# Patient Record
Sex: Male | Born: 1985
Health system: Southern US, Community
[De-identification: ages and names within clinical notes are randomized; demographics above are authoritative.]

## PROBLEM LIST (undated history)

## (undated) DIAGNOSIS — E785 Hyperlipidemia, unspecified: Secondary | ICD-10-CM

## (undated) DIAGNOSIS — N200 Calculus of kidney: Secondary | ICD-10-CM

## (undated) HISTORY — DX: Hyperlipidemia, unspecified: E78.5

## (undated) HISTORY — DX: Calculus of kidney: N20.0

---

## 2007-05-26 HISTORY — PX: APPENDECTOMY: SHX54

## 2015-10-21 ENCOUNTER — Emergency Department (HOSPITAL_COMMUNITY)
Admission: EM | Admit: 2015-10-21 | Discharge: 2015-10-22 | Disposition: A | Discharge status: Home / Self Care | Payer: BLUE CROSS/BLUE SHIELD | Attending: Emergency Medicine | Admitting: Emergency Medicine

## 2015-10-21 ENCOUNTER — Emergency Department (HOSPITAL_COMMUNITY): Payer: BLUE CROSS/BLUE SHIELD

## 2015-10-21 ENCOUNTER — Encounter (HOSPITAL_COMMUNITY): Payer: Self-pay

## 2015-10-21 DIAGNOSIS — Z79899 Other long term (current) drug therapy: Secondary | ICD-10-CM | POA: Insufficient documentation

## 2015-10-21 DIAGNOSIS — R000 Tachycardia, unspecified: Secondary | ICD-10-CM | POA: Diagnosis not present

## 2015-10-21 DIAGNOSIS — N4889 Other specified disorders of penis: Secondary | ICD-10-CM | POA: Diagnosis not present

## 2015-10-21 DIAGNOSIS — N200 Calculus of kidney: Secondary | ICD-10-CM | POA: Diagnosis not present

## 2015-10-21 DIAGNOSIS — R109 Unspecified abdominal pain: Secondary | ICD-10-CM | POA: Diagnosis present

## 2015-10-21 LAB — URINALYSIS, ROUTINE W REFLEX MICROSCOPIC
BILIRUBIN URINE: NEGATIVE
Glucose, UA: NEGATIVE mg/dL
Ketones, ur: 15 mg/dL — AB
Leukocytes, UA: NEGATIVE
NITRITE: NEGATIVE
PROTEIN: NEGATIVE mg/dL
Specific Gravity, Urine: 1.025 (ref 1.005–1.030)
pH: 6.5 (ref 5.0–8.0)

## 2015-10-21 LAB — COMPREHENSIVE METABOLIC PANEL
ALK PHOS: 49 U/L (ref 38–126)
ALT: 25 U/L (ref 17–63)
ANION GAP: 10 (ref 5–15)
AST: 27 U/L (ref 15–41)
Albumin: 4.4 g/dL (ref 3.5–5.0)
BILIRUBIN TOTAL: 1.4 mg/dL — AB (ref 0.3–1.2)
BUN: 19 mg/dL (ref 6–20)
CALCIUM: 10 mg/dL (ref 8.9–10.3)
CO2: 25 mmol/L (ref 22–32)
CREATININE: 1.2 mg/dL (ref 0.61–1.24)
Chloride: 100 mmol/L — ABNORMAL LOW (ref 101–111)
Glucose, Bld: 108 mg/dL — ABNORMAL HIGH (ref 65–99)
Potassium: 3.4 mmol/L — ABNORMAL LOW (ref 3.5–5.1)
Sodium: 135 mmol/L (ref 135–145)
TOTAL PROTEIN: 7.8 g/dL (ref 6.5–8.1)

## 2015-10-21 LAB — URINE MICROSCOPIC-ADD ON

## 2015-10-21 LAB — CBC
HCT: 41.6 % (ref 39.0–52.0)
Hemoglobin: 14.3 g/dL (ref 13.0–17.0)
MCH: 28.6 pg (ref 26.0–34.0)
MCHC: 34.4 g/dL (ref 30.0–36.0)
MCV: 83.2 fL (ref 78.0–100.0)
PLATELETS: 278 10*3/uL (ref 150–400)
RBC: 5 MIL/uL (ref 4.22–5.81)
RDW: 13 % (ref 11.5–15.5)
WBC: 7.7 10*3/uL (ref 4.0–10.5)

## 2015-10-21 LAB — LIPASE, BLOOD: Lipase: 21 U/L (ref 11–51)

## 2015-10-21 MED ORDER — FENTANYL CITRATE (PF) 100 MCG/2ML IJ SOLN
INTRAMUSCULAR | Status: AC
  Filled 2015-10-21: qty 2

## 2015-10-21 MED ORDER — SODIUM CHLORIDE 0.9 % IV BOLUS (SEPSIS)
1000.0000 mL | Freq: Once | INTRAVENOUS | Status: AC
  Administered 2015-10-21: 1000 mL via INTRAVENOUS

## 2015-10-21 MED ORDER — FENTANYL CITRATE (PF) 100 MCG/2ML IJ SOLN
50.0000 ug | INTRAMUSCULAR | Status: DC | PRN
  Administered 2015-10-21: 50 ug via INTRAVENOUS
  Filled 2015-10-21: qty 2

## 2015-10-21 MED ORDER — FENTANYL CITRATE (PF) 100 MCG/2ML IJ SOLN
50.0000 ug | INTRAMUSCULAR | Status: DC | PRN
  Administered 2015-10-21: 50 ug via NASAL

## 2015-10-21 NOTE — ED Provider Notes (Signed)
CSN: [id number]     Arrival date & time 10/21/15  1958 History   First MD Initiated Contact with Patient 10/21/15 2105     Chief Complaint  Patient presents with  . Flank Pain     (Consider location/radiation/quality/duration/timing/severity/associated sxs/prior Treatment) Patient is a 30 y.o. male presenting with general illness. The history is provided by the patient.  Illness Location:  Left-sided flank pain Severity:  Moderate Onset quality:  Sudden Duration:  3 hours Timing:  Constant Progression:  Unchanged Chronicity:  New Context:  History of kidney stones. Acute onset of left-sided flank pain 3 hours prior to arrival. Feels like prior kidney stones. Endorses nausea without emesis. Endorses some dysuria. No hematuria. No abdominal pain. Associated symptoms: nausea   Associated symptoms: no abdominal pain, no chest pain, no diarrhea, no fever, no headaches, no shortness of breath and no vomiting     History reviewed. No pertinent past medical history. History reviewed. No pertinent past surgical history. No family history on file. Social History  Substance Use Topics  . Smoking status: Never Smoker   . Smokeless tobacco: None  . Alcohol Use: No    Review of Systems  Constitutional: Negative for fever and chills.  Respiratory: Negative for shortness of breath.   Cardiovascular: Negative for chest pain.  Gastrointestinal: Positive for nausea. Negative for vomiting, abdominal pain and diarrhea.  Genitourinary: Positive for dysuria, flank pain and penile pain. Negative for hematuria, discharge, penile swelling, scrotal swelling, genital sores and testicular pain.  Neurological: Negative for light-headedness and headaches.  All other systems reviewed and are negative.     Allergies  Augmentin; Ceclor; and Codeine  Home Medications   Prior to Admission medications   Medication Sig Start Date End Date Taking? Authorizing Provider  simvastatin (ZOCOR) 10 MG tablet  Take 10 mg by mouth at bedtime.   Yes Historical Provider, MD   BP 141/86 mmHg  Pulse 90  Temp(Src) 97.4 F (36.3 C) (Oral)  Resp 16  SpO2 100% Physical Exam  Constitutional: He is oriented to person, place, and time. He appears well-developed and well-nourished. He appears distressed (Appears uncomfortable).  HENT:  Head: Normocephalic and atraumatic.  Eyes: EOM are normal. Pupils are equal, round, and reactive to light.  Cardiovascular: Regular rhythm and intact distal pulses.  Tachycardia present.   Pulmonary/Chest: Effort normal. No respiratory distress.  Abdominal: Soft. There is no tenderness. There is CVA tenderness. There is no rebound, no guarding, no tenderness at McBurney's point and negative Murphy's sign. Hernia confirmed negative in the right inguinal area and confirmed negative in the left inguinal area.  Genitourinary: Testes normal and penis normal. Right testis shows no tenderness. Cremasteric reflex is not absent on the right side. Left testis shows no tenderness. Cremasteric reflex is not absent on the left side. Circumcised. No penile tenderness. No discharge found.  Musculoskeletal: He exhibits no edema.  Neurological: He is alert and oriented to person, place, and time.  Skin: Skin is warm and dry.    ED Course  Procedures (including critical care time) Labs Review Labs Reviewed  COMPREHENSIVE METABOLIC PANEL - Abnormal; Notable for the following:    Potassium 3.4 (*)    Chloride 100 (*)    Glucose, Bld 108 (*)    Total Bilirubin 1.4 (*)    All other components within normal limits  URINALYSIS, ROUTINE W REFLEX MICROSCOPIC (NOT AT [facility]) - Abnormal; Notable for the following:    Hgb urine dipstick TRACE (*)  Ketones, ur 15 (*)    All other components within normal limits  URINE MICROSCOPIC-ADD ON - Abnormal; Notable for the following:    Squamous Epithelial / LPF 0-5 (*)    Bacteria, UA FEW (*)    All other components within normal limits  LIPASE, BLOOD   CBC    Imaging Review Ct Renal Stone Study  10/21/2015  CLINICAL DATA:  Left flank pain for 2 hours. History of kidney stones. EXAM: CT ABDOMEN AND PELVIS WITHOUT CONTRAST TECHNIQUE: Multidetector CT imaging of the abdomen and pelvis was performed following the standard protocol without IV contrast. COMPARISON:  None. FINDINGS: Lower chest:  The included lung bases are clear. Liver: No focal lesion allowing for lack contrast. Hepatobiliary: Gallbladder physiologically distended, no calcified stone. No biliary dilatation. Pancreas: No ductal dilatation or inflammation. Spleen: Normal. Adrenal glands: Left adrenal thickening without discrete nodule. Right adrenal gland is normal. Kidneys: There is a punctate obstructing stone at the left ureterovesicular junction with mild proximal hydroureteronephrosis. Minimal perinephric stranding. Punctate nonobstructing stone in the interpolar left kidney. Questionable punctate stone in the lower right kidney. No right obstructive uropathy. Right ureter is decompressed. Stomach/Bowel: Stomach physiologically distended. No bowel obstruction. Within the left mid abdomen is a single prominent proximal jejunal bowel loop with internal low-density. Small volume of stool throughout the colon without colonic wall thickening. The appendix is surgically absent. Vascular/Lymphatic: There are multiple small mesenteric lymph nodes in the left mid abdomen, largest measuring 2.3 x 0.9 cm. Haziness in the left upper quadrant mesentery in the region of increased lymph nodes. No retroperitoneal adenopathy. Abdominal aorta is normal in caliber. Reproductive: Normal sized prostate gland. Bladder: Physiologically distended, no wall thickening. Other: No free air, free fluid, or intra-abdominal fluid collection. Musculoskeletal: There are no acute or suspicious osseous abnormalities. Ossification within the left adductor musculature consistent with remote prior injury. IMPRESSION: 1.  Obstructing punctate stone at the left ureterovesicular junction with mild proximal hydroureteronephrosis. 2. Additional punctate bilateral nonobstructing nephrolithiasis. 3. Prominent mesenteric lymph nodes and haziness in the adjacent mesentery in the left mid abdomen. There is an adjacent prominent proximal jejunal bowel loop with internal low-density, which may be simply ingested contents, however the adjacent lymph nodes raises concern for underlying infectious or potentially neoplastic etiology. Comparison with any prior imaging would be most helpful to evaluate for chronicity. In the absence of prior imaging, recommend a follow-up CT in 2-3 months, WITH oral contrast. Electronically Signed   By: Rubye Oaks M.D.   On: 10/21/2015 22:14   I have personally reviewed and evaluated these images and lab results as part of my medical decision-making.   EKG Interpretation None      MDM   Final diagnoses:  Nephrolithiasis    30 year old male with a history of nephrolithiasis presented with left-sided flank pain. History as above. Patient didn't appear uncomfortable on arrival. Patient had no tenderness to palpation of his abdomen. No perineal signs. Doubt acute abdomen. GU exam was normal. No evidence of any inguinal hernia. No evidence of testicular torsion. No tenderness palpation of testicles to indicate orchitis or epididymitis. CT of his abdomen pelvis did show evidence of nephrolithiasis. Mild hydronephrosis. Stomach is punctate in size; will likely be able to pass on its own. Gave the patient an IV fluid bolus, pain medications, antiemetics with improvement in symptoms. Feel that the patient is appropriate for discharge home with outpatient follow-up. Will give up her prescription for antiemetics, pain medications, Flomax. Will have him follow-up with  his primary care doctor as soon as possible for reevaluation.  Stricture precautions provided. Patient discharged in stable  condition.  Lindalou Hose, MD 10/22/15 1610  Vanetta Mulders, MD 10/22/15 [phone]

## 2015-10-21 NOTE — ED Notes (Signed)
Pt here with c/o left flank pain and burning to penis, onset today. Hx of kidney stones.

## 2015-10-22 MED ORDER — OXYCODONE-ACETAMINOPHEN 5-325 MG PO TABS: 1.0000 | ORAL_TABLET | Freq: Four times a day (QID) | ORAL | Status: DC | PRN

## 2015-10-22 MED ORDER — ONDANSETRON HCL 4 MG PO TABS: 4.0000 mg | ORAL_TABLET | Freq: Three times a day (TID) | ORAL | Status: DC | PRN

## 2015-10-22 NOTE — Discharge Instructions (Signed)
You have a kidney stone.  Take Percocet for pain control. Use Motrin as well. Take Zofran for nausea control.  See another doctor in a couple days for reevaluation.

## 2015-10-25 ENCOUNTER — Encounter: Payer: Self-pay | Admitting: Physician Assistant

## 2015-11-06 ENCOUNTER — Ambulatory Visit (INDEPENDENT_AMBULATORY_CARE_PROVIDER_SITE_OTHER): Payer: BLUE CROSS/BLUE SHIELD | Admitting: Physician Assistant

## 2015-11-06 ENCOUNTER — Encounter: Payer: Self-pay | Admitting: *Deleted

## 2015-11-06 ENCOUNTER — Other Ambulatory Visit (INDEPENDENT_AMBULATORY_CARE_PROVIDER_SITE_OTHER): Payer: BLUE CROSS/BLUE SHIELD

## 2015-11-06 VITALS — BP 100/60 | HR 80 | Ht 74.0 in | Wt 221.0 lb

## 2015-11-06 DIAGNOSIS — R935 Abnormal findings on diagnostic imaging of other abdominal regions, including retroperitoneum: Secondary | ICD-10-CM

## 2015-11-06 DIAGNOSIS — R195 Other fecal abnormalities: Secondary | ICD-10-CM

## 2015-11-06 DIAGNOSIS — N201 Calculus of ureter: Secondary | ICD-10-CM

## 2015-11-06 LAB — SEDIMENTATION RATE: Sed Rate: 5 mm/hr (ref 0–15)

## 2015-11-06 LAB — HIGH SENSITIVITY CRP: CRP HIGH SENSITIVITY: 2.19 mg/L (ref 0.000–5.000)

## 2015-11-06 NOTE — Patient Instructions (Signed)
Your physician has requested that you go to the basement for the following lab work before leaving today: Sed rate, CRP  You have been scheduled for a CT enterography scan of the abdomen and pelvis at [facility] ([address]---this is in the same building as Press [facility]).   You are scheduled on Monday, 11/11/15 at 3:00 pm. You should arrive at 1:45 pm for registration and prep. Please follow the written instructions below on the day of your exam:  WARNING: IF YOU ARE ALLERGIC TO IODINE/X-RAY DYE, PLEASE NOTIFY RADIOLOGY IMMEDIATELY AT [phone]! YOU WILL BE GIVEN A 13 HOUR PREMEDICATION PREP.  1) Do not eat or drink anything after 11:00 am (4 hours prior to your test)  You may take any medications as prescribed with a small amount of water except for the following: Metformin, Glucophage, Glucovance, Avandamet, Riomet, Fortamet, Actoplus Met, Janumet, Glumetza or Metaglip. The above medications must be held the day of the exam AND 48 hours after the exam.  If you have any questions regarding your exam or if you need to reschedule, you may call the CT department at [phone] between the hours of 8:00 am and 5:00 pm, Monday-Friday.  ________________________________________________________________________  If you are age 53 or older, your body mass index should be between 23-30. Your Body mass index is 28.36 kg/(m^2). If this is out of the aforementioned range listed, please consider follow up with your Primary Care Provider.  If you are age 36 or younger, your body mass index should be between 19-25. Your Body mass index is 28.36 kg/(m^2). If this is out of the aformentioned range listed, please consider follow up with your Primary Care Provider.

## 2015-11-06 NOTE — Progress Notes (Signed)
Patient ID: David Osborn, male   DOB: 10/04/1985, 30 y.o.   MRN: [id number]   Subjective:    Patient ID: David Osborn, male    DOB: 07/21/1985, 30 y.o.   MRN: [id number]  HPI  David Osborn is a pleasant 30 year old white male, new today referred by Dr. Leota [facility]sen[facility] for further evaluation of an abnormal CT of the abdomen. Patient had an ER visit on 10/21/2015 with acute left flank pain and underwent a noncontrasted stone protocol CT of the abdomen which did show a punctate obstructing stone in the left UVJ junction with proximal hydronephrosis and mild perinephric stranding. He also has a probable punctate stone in the right kidney. It was also noted that he had multiple mesenteric lymph nodes and haziness in the left upper quadrant mesentery  There is no adjacent prominent proximal jejunal bowel loop with internal low-density easing concern for underlying infectious or essentially neoplastic etiology. Follow-up imaging in 2-3 months was recommended with contrast. Labs are reviewed from his ER visit, he had a normal CBC, potassium 3.4 total bili 1.4 ,and remainder of liver tests normal.   He says he passed a kidney stone shortly after that visit and feels fine. He has had no prior GI issues has no complaints of abdominal pain. His appetite has been fine, weight has been stable. He has noticed over the past year that he has had persistently looser stools than what used to be normal for him. He is also generally having 2-3 bowel movements per day where he used to have one regular bowel movement per day. He has not noted any melena or hematochezia. He is status post appendectomy ,no other abdominal surgery No family history of IBD does have a paternal grandfather who had colon cancer in his 2560s and father has polyps.  Review of Systems Pertinent positive and negative review of systems were noted in the above HPI section.  All other review of systems was otherwise negative.  Outpatient Encounter Prescriptions  as of 11/06/2015  Medication Sig  . simvastatin (ZOCOR) 10 MG tablet Take 10 mg by mouth at bedtime.  . [DISCONTINUED] ondansetron (ZOFRAN) 4 MG tablet Take 1 tablet (4 mg total) by mouth every 8 (eight) hours as needed for nausea or vomiting.  . [DISCONTINUED] oxyCODONE-acetaminophen (PERCOCET/ROXICET) 5-325 MG tablet Take 1 tablet by mouth every 6 (six) hours as needed for severe pain.   No facility-administered encounter medications on file as of 11/06/2015.   Allergies  Allergen Reactions  . Augmentin [Amoxicillin-Pot Clavulanate]   . Ceclor [Cefaclor]   . Codeine    Patient Active Problem List   Diagnosis Date Noted  . Ureterolithiasis 11/06/2015   Social History   Social History  . Marital Status: Married    Spouse Name: N/A  . Number of Children: 2  . Years of Education: N/A   Occupational History  . Forensic scientistchemical engineer    Social History Main Topics  . Smoking status: Never Smoker   . Smokeless tobacco: Never Used  . Alcohol Use: 0.0 oz/week    0 Standard drinks or equivalent per week     Comment: social  . Drug Use: No  . Sexual Activity: Not on file   Other Topics Concern  . Not on file   Social History Narrative    David Osborn's family history includes Colon cancer in his paternal grandfather; Colon polyps in his father and paternal grandfather; Diabetes in his maternal grandmother and mother; Heart disease in his maternal grandmother and paternal grandfather;  Kidney nephrosis in his father and paternal grandfather; Prostate cancer in his paternal grandfather.      Objective:    Filed Vitals:   11/06/15 1438  BP: 100/60  Pulse: 80    Physical Exam  well-developed young white male in no acute distress, accompanied by his wife blood pressure 100/60 pulse 80, height foot 2 weight 221 BMI 28. HEENT ;nontraumatic, cephalic EOMI PERRLA sclera anicteric, Cardiovascular; regular rate and rhythm with S1-S2 no murmur or gallop, Pulmonary; clear bilaterally, Abdomen  ;soft, nontender, nondistended bowel sounds are active there is no palpable mass or hepatosplenomegaly. Rectal ;exam not done, Extremities; no clubbing cyanosis or edema skin warm and dry, Neuropsych; mood and affect appropriate     Assessment & Plan:   #83 30 year old white male with recent noncontrasted CT of the abdomen, abnormal with finding of prominent mesenteric lymph nodes easiness in the adjacent mesentery in the left mid abdomen adjacent to a prominent proximal jejunal bowel loop. Etiology not clear rule out underlying infectious, inflammatory or potentially neoplastic process. Patient currently asymptomatic with the exception of change in bowel habits over the past 1 year, with more frequent looser stools. #2 History of ureteral lithiasis with recently passed stone  Plan; check sedimentation rate and CRP Will schedule for CT of the abdomen/pelvis  with enterography. Further plans pending results of above.  Patient will be established with David Osborn   David Mellone S Mckale Haffey PA-C 11/06/2015   Cc: No ref. provider found

## 2015-11-07 NOTE — Progress Notes (Signed)
Agree 

## 2015-11-11 ENCOUNTER — Ambulatory Visit (INDEPENDENT_AMBULATORY_CARE_PROVIDER_SITE_OTHER)
Admission: RE | Admit: 2015-11-11 | Discharge: 2015-11-11 | Disposition: A | Discharge status: Home / Self Care | Payer: BLUE CROSS/BLUE SHIELD | Source: Ambulatory Visit | Attending: Physician Assistant | Admitting: Physician Assistant

## 2015-11-11 DIAGNOSIS — R935 Abnormal findings on diagnostic imaging of other abdominal regions, including retroperitoneum: Secondary | ICD-10-CM

## 2015-11-11 DIAGNOSIS — R195 Other fecal abnormalities: Secondary | ICD-10-CM

## 2015-11-11 MED ORDER — IOPAMIDOL (ISOVUE-300) INJECTION 61%
100.0000 mL | Freq: Once | INTRAVENOUS | Status: AC | PRN
  Administered 2015-11-11: 100 mL via INTRAVENOUS

## 2015-11-11 MED ORDER — BARIUM SULFATE 0.1 % PO SUSP: 450.0000 mL | Freq: Once | ORAL | Status: DC

## 2016-04-20 ENCOUNTER — Encounter (HOSPITAL_BASED_OUTPATIENT_CLINIC_OR_DEPARTMENT_OTHER): Payer: Self-pay

## 2016-04-20 ENCOUNTER — Emergency Department (HOSPITAL_BASED_OUTPATIENT_CLINIC_OR_DEPARTMENT_OTHER)
Admission: EM | Admit: 2016-04-20 | Discharge: 2016-04-20 | Disposition: A | Discharge status: Home / Self Care | Payer: BLUE CROSS/BLUE SHIELD | Attending: Emergency Medicine | Admitting: Emergency Medicine

## 2016-04-20 ENCOUNTER — Emergency Department (HOSPITAL_BASED_OUTPATIENT_CLINIC_OR_DEPARTMENT_OTHER): Payer: BLUE CROSS/BLUE SHIELD

## 2016-04-20 DIAGNOSIS — Z79899 Other long term (current) drug therapy: Secondary | ICD-10-CM | POA: Diagnosis not present

## 2016-04-20 DIAGNOSIS — R05 Cough: Secondary | ICD-10-CM | POA: Diagnosis present

## 2016-04-20 DIAGNOSIS — J189 Pneumonia, unspecified organism: Secondary | ICD-10-CM | POA: Insufficient documentation

## 2016-04-20 DIAGNOSIS — J9801 Acute bronchospasm: Secondary | ICD-10-CM | POA: Insufficient documentation

## 2016-04-20 MED ORDER — LEVOFLOXACIN 750 MG PO TABS
750.0000 mg | ORAL_TABLET | Freq: Once | ORAL | Status: AC
  Administered 2016-04-20: 750 mg via ORAL
  Filled 2016-04-20: qty 1

## 2016-04-20 MED ORDER — ALBUTEROL SULFATE (2.5 MG/3ML) 0.083% IN NEBU
5.0000 mg | INHALATION_SOLUTION | Freq: Once | RESPIRATORY_TRACT | Status: AC
Start: 2016-04-20 — End: 2016-04-20
  Administered 2016-04-20: 5 mg via RESPIRATORY_TRACT
  Filled 2016-04-20: qty 6

## 2016-04-20 MED ORDER — ALBUTEROL SULFATE HFA 108 (90 BASE) MCG/ACT IN AERS
2.0000 | INHALATION_SPRAY | Freq: Once | RESPIRATORY_TRACT | Status: AC
  Administered 2016-04-20: 2 via RESPIRATORY_TRACT
  Filled 2016-04-20: qty 6.7

## 2016-04-20 MED ORDER — IPRATROPIUM BROMIDE 0.02 % IN SOLN
0.5000 mg | Freq: Once | RESPIRATORY_TRACT | Status: AC
  Administered 2016-04-20: 0.5 mg via RESPIRATORY_TRACT
  Filled 2016-04-20: qty 2.5

## 2016-04-20 MED ORDER — LEVOFLOXACIN 750 MG PO TABS: 750.0000 mg | ORAL_TABLET | Freq: Every day | ORAL | 0 refills | Status: DC

## 2016-04-20 MED FILL — levoFLOXacin 750 MG TABS: 750 | 6 days supply | Qty: 6 | Fill #0

## 2016-04-20 NOTE — ED Provider Notes (Signed)
[facility] Provider Note   CSN: [id number] Arrival date & time: 04/20/16  1243     History   Chief Complaint Chief Complaint  Patient presents with  . Cough    HPI David Osborn is a 30 y.o. male.  HPI The patient is a healthy 30 year old man who presents to emergency department with ongoing productive cough with some occasional shortness of breath since Thursday yesterday.  Was sent to the ER from the [facility] for further evaluation.  No fevers or chills.  Does report productive cough.  Patient has multiple children under the age of 8 at home.  Several been sick with viral illness.  He denies abdominal pain.  No chest pain.   Past Medical History:  Diagnosis Date  . Hyperlipidemia   . Nephrolithiasis     Patient Active Problem List   Diagnosis Date Noted  . Ureterolithiasis 11/06/2015    Past Surgical History:  Procedure Laterality Date  . APPENDECTOMY  2009       Home Medications    Prior to Admission medications   Medication Sig Start Date End Date Taking? Authorizing Provider  neomycin-polymyxin-hydrocortisone (CORTISPORIN) 3.5-10000-1 ophthalmic suspension 4 (four) times daily.   Yes Historical Provider, MD  levofloxacin (LEVAQUIN) 750 MG tablet Take 1 tablet (750 mg total) by mouth daily. 04/21/16   Azalia BilisKevin Shaely Gadberry, MD  simvastatin (ZOCOR) 10 MG tablet Take 10 mg by mouth at bedtime.    Historical Provider, MD    Family History Family History  Problem Relation Age of Onset  . Colon cancer Paternal Grandfather   . Colon polyps Paternal Grandfather   . Prostate cancer Paternal Grandfather   . Heart disease Paternal Grandfather   . Kidney nephrosis Paternal Grandfather   . Colon polyps Father   . Kidney nephrosis Father   . Diabetes Mother   . Diabetes Maternal Grandmother   . Heart disease Maternal Grandmother     Social History Social History  Substance Use Topics  . Smoking status: Never Smoker  . Smokeless tobacco:  Never Used  . Alcohol use 0.0 oz/week     Comment: social     Allergies   Augmentin [amoxicillin-pot clavulanate]; Ceclor [cefaclor]; and Codeine   Review of Systems Review of Systems  All other systems reviewed and are negative.    Physical Exam Updated Vital Signs BP 114/78 (BP Location: Left Arm)   Pulse 95   Temp 98 F (36.7 C) (Oral)   Resp 18   Ht 6\' 3"  (1.905 m)   Wt 226 lb 3.2 oz (102.6 kg)   SpO2 96%   BMI 28.27 kg/m   Physical Exam  Constitutional: He is oriented to person, place, and time. He appears well-developed and well-nourished.  HENT:  Head: Normocephalic and atraumatic.  Eyes: EOM are normal.  Neck: Normal range of motion.  Cardiovascular: Normal rate, regular rhythm, normal heart sounds and intact distal pulses.   Pulmonary/Chest: Effort normal. No respiratory distress. He has wheezes.  Mild rhonchi bilaterally  Abdominal: Soft. He exhibits no distension. There is no tenderness.  Musculoskeletal: Normal range of motion.  Neurological: He is alert and oriented to person, place, and time.  Skin: Skin is warm and dry.  Psychiatric: He has a normal mood and affect. Judgment normal.  Nursing note and vitals reviewed.    ED Treatments / Results  Labs (all labs ordered are listed, but only abnormal results are displayed) Labs Reviewed - No data to display  EKG  EKG Interpretation None       Radiology Dg Chest 2 View  Result Date: 04/20/2016 CLINICAL DATA:  Productive cough. EXAM: CHEST  2 VIEW COMPARISON:  No recent prior. FINDINGS: Mediastinum and hilar structures normal . Minimal right infrahilar infiltrate cannot be excluded. No pertinent effusion or pneumothorax. Heart size normal. No acute bony abnormality . IMPRESSION: Minimal right infrahilar infiltrate cannot be completely excluded . Electronically Signed   By: Maisie Fushomas  Register   On: 04/20/2016 13:09    Procedures Procedures (including critical care time)  Medications Ordered  in ED Medications  albuterol (PROVENTIL) (2.5 MG/3ML) 0.083% nebulizer solution 5 mg (5 mg Nebulization Given 04/20/16 1323)  ipratropium (ATROVENT) nebulizer solution 0.5 mg (0.5 mg Nebulization Given 04/20/16 1323)  levofloxacin (LEVAQUIN) tablet 750 mg (750 mg Oral Given 04/20/16 1328)  albuterol (PROVENTIL HFA;VENTOLIN HFA) 108 (90 Base) MCG/ACT inhaler 2 puff (2 puffs Inhalation Given 04/20/16 1431)     Initial Impression / Assessment and Plan / ED Course  I have reviewed the triage vital signs and the nursing notes.  Pertinent labs & imaging results that were available during my care of the patient were reviewed by me and considered in my medical decision making (see chart for details).  Clinical Course     Developing pneumonia with associated bronchospasm.  Patient feels much better after bronchodilators.  Home with albuterol.  Home with a week course of Levaquin.  Patient understands return to the ER for new or worsening symptoms  Final Clinical Impressions(s) / ED Diagnoses   Final diagnoses:  Community acquired pneumonia, unspecified laterality  Bronchospasm    New Prescriptions New Prescriptions   LEVOFLOXACIN (LEVAQUIN) 750 MG TABLET    Take 1 tablet (750 mg total) by mouth daily.     Azalia BilisKevin Jonathon Tan, MD 04/20/16 [phone]

## 2016-04-20 NOTE — ED Triage Notes (Signed)
C/o prod cough since last Thursday-sent from health center at work to r/o PNE-NAD-steady gait

## 2016-07-08 ENCOUNTER — Ambulatory Visit (HOSPITAL_COMMUNITY)
Admission: EM | Admit: 2016-07-08 | Discharge: 2016-07-08 | Disposition: A | Discharge status: Home / Self Care | Payer: BLUE CROSS/BLUE SHIELD | Attending: Family Medicine | Admitting: Family Medicine

## 2016-07-08 ENCOUNTER — Encounter (HOSPITAL_COMMUNITY): Payer: Self-pay | Admitting: Emergency Medicine

## 2016-07-08 DIAGNOSIS — J0101 Acute recurrent maxillary sinusitis: Secondary | ICD-10-CM

## 2016-07-08 DIAGNOSIS — R05 Cough: Secondary | ICD-10-CM

## 2016-07-08 DIAGNOSIS — R69 Illness, unspecified: Principal | ICD-10-CM

## 2016-07-08 DIAGNOSIS — J40 Bronchitis, not specified as acute or chronic: Secondary | ICD-10-CM

## 2016-07-08 DIAGNOSIS — R059 Cough, unspecified: Secondary | ICD-10-CM

## 2016-07-08 DIAGNOSIS — J111 Influenza due to unidentified influenza virus with other respiratory manifestations: Secondary | ICD-10-CM

## 2016-07-08 MED ORDER — ALBUTEROL SULFATE HFA 108 (90 BASE) MCG/ACT IN AERS: 1.0000 | INHALATION_SPRAY | Freq: Four times a day (QID) | RESPIRATORY_TRACT | 0 refills | Status: DC | PRN

## 2016-07-08 MED ORDER — KETOROLAC TROMETHAMINE 60 MG/2ML IM SOLN: 60.0000 mg | Freq: Once | INTRAMUSCULAR | Status: DC

## 2016-07-08 MED ORDER — BENZONATATE 100 MG PO CAPS: 200.0000 mg | ORAL_CAPSULE | Freq: Three times a day (TID) | ORAL | 0 refills | Status: DC | PRN

## 2016-07-08 MED ORDER — METHYLPREDNISOLONE 4 MG PO TBPK: ORAL_TABLET | ORAL | 0 refills | Status: DC

## 2016-07-08 MED ORDER — CLARITHROMYCIN 500 MG PO TABS: 500.0000 mg | ORAL_TABLET | Freq: Two times a day (BID) | ORAL | 0 refills | Status: DC

## 2016-07-08 MED ORDER — ALBUTEROL SULFATE (2.5 MG/3ML) 0.083% IN NEBU
INHALATION_SOLUTION | RESPIRATORY_TRACT | Status: AC
  Filled 2016-07-08: qty 3

## 2016-07-08 MED ORDER — ALBUTEROL SULFATE (2.5 MG/3ML) 0.083% IN NEBU
2.5000 mg | INHALATION_SOLUTION | Freq: Once | RESPIRATORY_TRACT | Status: AC
  Administered 2016-07-08: 2.5 mg via RESPIRATORY_TRACT

## 2016-07-08 MED ORDER — OSELTAMIVIR PHOSPHATE 75 MG PO CAPS: 75.0000 mg | ORAL_CAPSULE | Freq: Two times a day (BID) | ORAL | 0 refills | Status: DC

## 2016-07-08 NOTE — ED Provider Notes (Signed)
CSN: [id number]     Arrival date & time 07/08/16  1027 History   First MD Initiated Contact with Patient 07/08/16 1203     Chief Complaint  Patient presents with  . Facial Pain  . Cough   (Consider location/radiation/quality/duration/timing/severity/associated sxs/prior Treatment) Patient c/o sinus pressure and discharge for a week and he developed chills fever and weakness yesterday.  He has also developed a cough and wheezing.  He has been using his SABA rescue inhaler and is just about out.   The history is provided by the patient.  Cough  Cough characteristics:  Non-productive Sputum characteristics:  Nondescript Severity:  Moderate Onset quality:  Sudden Duration:  1 week Timing:  Constant Progression:  Worsening Chronicity:  New Smoker: no   Context: upper respiratory infection and weather changes   Relieved by:  Nothing Worsened by:  Nothing Ineffective treatments:  Beta-agonist inhaler Associated symptoms: wheezing     Past Medical History:  Diagnosis Date  . Hyperlipidemia   . Nephrolithiasis    Past Surgical History:  Procedure Laterality Date  . APPENDECTOMY  2009   Family History  Problem Relation Age of Onset  . Colon cancer Paternal Grandfather   . Colon polyps Paternal Grandfather   . Prostate cancer Paternal Grandfather   . Heart disease Paternal Grandfather   . Kidney nephrosis Paternal Grandfather   . Colon polyps Father   . Kidney nephrosis Father   . Diabetes Mother   . Diabetes Maternal Grandmother   . Heart disease Maternal Grandmother    Social History  Substance Use Topics  . Smoking status: Never Smoker  . Smokeless tobacco: Never Used  . Alcohol use 0.0 oz/week     Comment: social    Review of Systems  Constitutional: Positive for fatigue.  HENT: Negative.   Eyes: Negative.   Respiratory: Positive for cough and wheezing.   Cardiovascular: Negative.   Gastrointestinal: Negative.   Endocrine: Negative.   Genitourinary:  Negative.   Musculoskeletal: Negative.   Allergic/Immunologic: Negative.   Neurological: Negative.   Hematological: Negative.   Psychiatric/Behavioral: Negative.     Allergies  Augmentin [amoxicillin-pot clavulanate]; Ceclor [cefaclor]; and Codeine  Home Medications   Prior to Admission medications   Medication Sig Start Date End Date Taking? Authorizing Provider  simvastatin (ZOCOR) 10 MG tablet Take 10 mg by mouth at bedtime.   Yes Historical Provider, MD  albuterol (PROVENTIL HFA;VENTOLIN HFA) 108 (90 Base) MCG/ACT inhaler Inhale 1-2 puffs into the lungs every 6 (six) hours as needed for wheezing or shortness of breath. 07/08/16   Deatra CanterWilliam J Marvell Stavola, FNP  benzonatate (TESSALON) 100 MG capsule Take 2 capsules (200 mg total) by mouth 3 (three) times daily as needed for cough. 07/08/16   Deatra CanterWilliam J Talesha Ellithorpe, FNP  clarithromycin (BIAXIN) 500 MG tablet Take 1 tablet (500 mg total) by mouth 2 (two) times daily. 07/08/16   Deatra CanterWilliam J Jessey Stehlin, FNP  methylPREDNISolone (MEDROL DOSEPAK) 4 MG TBPK tablet Take 6-5-4-3-2-1 po qd 07/08/16   Deatra CanterWilliam J Younis Mathey, FNP  oseltamivir (TAMIFLU) 75 MG capsule Take 1 capsule (75 mg total) by mouth every 12 (twelve) hours. 07/08/16   Deatra CanterWilliam J Iver Miklas, FNP   Meds Ordered and Administered this Visit   Medications  albuterol (PROVENTIL) (2.5 MG/3ML) 0.083% nebulizer solution 2.5 mg (2.5 mg Nebulization Given 07/08/16 1156)    BP 113/75 (BP Location: Right Arm)   Pulse 116   Temp 99.6 F (37.6 C) (Oral)   Resp 16   SpO2 97%  No data found.   Physical Exam  Constitutional: He is oriented to person, place, and time. He appears well-developed and well-nourished.  HENT:  Head: Normocephalic and atraumatic.  Right Ear: External ear normal.  Left Ear: External ear normal.  Mouth/Throat: Oropharynx is clear and moist.  Eyes: Conjunctivae and EOM are normal. Pupils are equal, round, and reactive to light.  Neck: Normal range of motion. Neck supple.  Cardiovascular:  Normal rate, regular rhythm and normal heart sounds.   Pulmonary/Chest: Effort normal. He has wheezes.  Abdominal: Soft. Bowel sounds are normal.  Neurological: He is alert and oriented to person, place, and time.  Nursing note and vitals reviewed.   Urgent Care Course     Procedures (including critical care time)  Labs Review Labs Reviewed - No data to display  Imaging Review No results found.   Visual Acuity Review  Right Eye Distance:   Left Eye Distance:   Bilateral Distance:    Right Eye Near:   Left Eye Near:    Bilateral Near:         MDM   1. Influenza-like illness   2. Bronchitis   3. Cough   4. Acute recurrent maxillary sinusitis    Medrol dose pack as directed 4mg  #21 Biaxin 500mg  one po bid x 10 days #20 Tessalon Perles 200mg  one po tid prn #21 Albuterol MDI 1-2 puffs q 4 hours prn #1  Push po fluids, rest, tylenol and motrin otc prn as directed for fever, arthralgias, and myalgias.  Follow up prn if sx's continue or persist.    Deatra Canter, FNP 07/08/16 1235

## 2016-07-08 NOTE — ED Triage Notes (Signed)
[facility] a complaint of a cough and sinus pressure x 1 week. The patient was seen today by [facility] Art[facility]l [facility] and referred her for further testing.

## 2016-08-27 ENCOUNTER — Encounter (HOSPITAL_COMMUNITY): Payer: Self-pay | Admitting: Emergency Medicine

## 2016-08-27 ENCOUNTER — Ambulatory Visit (HOSPITAL_COMMUNITY)
Admission: EM | Admit: 2016-08-27 | Discharge: 2016-08-27 | Disposition: A | Discharge status: Home / Self Care | Payer: BLUE CROSS/BLUE SHIELD | Attending: Family Medicine | Admitting: Family Medicine

## 2016-08-27 DIAGNOSIS — S46912A Strain of unspecified muscle, fascia and tendon at shoulder and upper arm level, left arm, initial encounter: Secondary | ICD-10-CM

## 2016-08-27 DIAGNOSIS — R0781 Pleurodynia: Secondary | ICD-10-CM | POA: Diagnosis not present

## 2016-08-27 DIAGNOSIS — S29011A Strain of muscle and tendon of front wall of thorax, initial encounter: Secondary | ICD-10-CM | POA: Diagnosis not present

## 2016-08-27 MED ORDER — KETOROLAC TROMETHAMINE 60 MG/2ML IM SOLN
INTRAMUSCULAR | Status: AC
  Filled 2016-08-27: qty 2

## 2016-08-27 MED ORDER — DICLOFENAC POTASSIUM 50 MG PO TABS: 50.0000 mg | ORAL_TABLET | Freq: Three times a day (TID) | ORAL | 0 refills | Status: DC

## 2016-08-27 MED ORDER — KETOROLAC TROMETHAMINE 60 MG/2ML IM SOLN
60.0000 mg | Freq: Once | INTRAMUSCULAR | Status: AC
  Administered 2016-08-27: 60 mg via INTRAMUSCULAR

## 2016-08-27 NOTE — Discharge Instructions (Signed)
Unable to explain the mechanism of injury to the left shoulder musculature. For the first day or 2 use cold compresses or ice to the area. After that use heat to the muscles. If you develop any new symptoms particularly shortness of breath, chest pain such as heaviness, tightness, pressure or fullness, pain in the middle of the back, sudden sweating, feeling fainty or other similar worrisome problems call 911 or go to the emergency department promptly.

## 2016-08-27 NOTE — ED Triage Notes (Signed)
PT was throwing rocks into a pond and threw a small stone with great force. PT immediately felt pain in shoulder. Pain now radiates to chest, neck, and down upper arm. PT denies SOB. PT reports severe pain with breaths and PT reports pain with palpation over left chest.

## 2016-08-27 NOTE — ED Provider Notes (Signed)
CSN: [id number]     Arrival date & time 08/27/16  1746 History   First MD Initiated Contact with Patient 08/27/16 1858     Chief Complaint  Patient presents with  . Shoulder Injury   (Consider location/radiation/quality/duration/timing/severity/associated sxs/prior Treatment) 31 year old male was throwing rocks this afternoon and with his right arm he threw a rock very hard. Nearly immediately he developed pain in the left shoulder. The pain initially occurred in the supraspinatus muscle followed by pain to the trapezius and the left pectoralis. He denies any sort of trauma or known injury to the left shoulder. Denies heaviness, tightness or pressure in the chest. The pain does hurt worse with movement and taking a deep breath.      Past Medical History:  Diagnosis Date  . Hyperlipidemia   . Nephrolithiasis    Past Surgical History:  Procedure Laterality Date  . APPENDECTOMY  2009   Family History  Problem Relation Age of Onset  . Colon cancer Paternal Grandfather   . Colon polyps Paternal Grandfather   . Prostate cancer Paternal Grandfather   . Heart disease Paternal Grandfather   . Kidney nephrosis Paternal Grandfather   . Colon polyps Father   . Kidney nephrosis Father   . Diabetes Mother   . Diabetes Maternal Grandmother   . Heart disease Maternal Grandmother    Social History  Substance Use Topics  . Smoking status: Never Smoker  . Smokeless tobacco: Never Used  . Alcohol use 0.0 oz/week     Comment: social    Review of Systems  Constitutional: Positive for activity change. Negative for fever.  HENT: Negative.   Respiratory:       Patient states he feels a little short of breath.  Cardiovascular: Positive for chest pain.  Gastrointestinal: Negative.   Genitourinary: Negative.   Musculoskeletal: Positive for myalgias. Negative for gait problem.  Skin: Negative.   Neurological: Negative.   All other systems reviewed and are negative.   Allergies  Augmentin  [amoxicillin-pot clavulanate]; Ceclor [cefaclor]; and Codeine  Home Medications   Prior to Admission medications   Medication Sig Start Date End Date Taking? Authorizing Provider  albuterol (PROVENTIL HFA;VENTOLIN HFA) 108 (90 Base) MCG/ACT inhaler Inhale 1-2 puffs into the lungs every 6 (six) hours as needed for wheezing or shortness of breath. 07/08/16   Deatra Canter, FNP  benzonatate (TESSALON) 100 MG capsule Take 2 capsules (200 mg total) by mouth 3 (three) times daily as needed for cough. 07/08/16   Deatra Canter, FNP  clarithromycin (BIAXIN) 500 MG tablet Take 1 tablet (500 mg total) by mouth 2 (two) times daily. 07/08/16   Deatra Canter, FNP  diclofenac (CATAFLAM) 50 MG tablet Take 1 tablet (50 mg total) by mouth 3 (three) times daily. One tablet TID with food prn pain. 08/27/16   Hayden Rasmussen, NP  methylPREDNISolone (MEDROL DOSEPAK) 4 MG TBPK tablet Take 6-5-4-3-2-1 po qd 07/08/16   Deatra Canter, FNP  oseltamivir (TAMIFLU) 75 MG capsule Take 1 capsule (75 mg total) by mouth every 12 (twelve) hours. 07/08/16   Deatra Canter, FNP  simvastatin (ZOCOR) 10 MG tablet Take 10 mg by mouth at bedtime.    Historical Provider, MD   Meds Ordered and Administered this Visit   Medications  ketorolac (TORADOL) injection 60 mg (not administered)    BP 120/78   Pulse (!) 105   Temp 98.2 F (36.8 C) (Oral)   Resp 20   Ht  (1.88 m)  Wt 220 lb (99.8 kg)   SpO2 98%   BMI 28.25 kg/m  No data found.   Physical Exam  Constitutional: He is oriented to person, place, and time. He appears well-developed and well-nourished. No distress.  HENT:  Head: Normocephalic and atraumatic.  Eyes: EOM are normal.  Neck: Normal range of motion. Neck supple.  No cervical tenderness.  Cardiovascular: Normal rate, regular rhythm, normal heart sounds and intact distal pulses.   Pulmonary/Chest: Breath sounds normal. No respiratory distress. He has no wheezes. He has no rales. He exhibits  tenderness.  Patient is taking deep breaths but not tachypneic. Appears anxious. Lungs are perfectly clear. No adventitious sounds whatsoever. There is air movement and breath sounds in all areas.  Tenderness to light and deeper palpation to the left pectoralis muscle. There is tenderness over the ribs just beneath the left pectoralis muscle. Tenderness to the left supraspinatus, ridge of the trapezius. No tenderness to the acromioclavicular joint or along the joint line. He is able to abduct the arm above 90 and internally rotate and externally rotate the shoulder. Distal neurovascular motor sensory is intact. Pulses 2+.  Musculoskeletal: Normal range of motion. He exhibits tenderness. He exhibits no edema or deformity.  See evaluation of chest tenderness above. Note that the patient has no pain, tenderness or decrease in range of motion of the right shoulder. He demonstrates brisk movement without discomfort.  Neurological: He is alert and oriented to person, place, and time.  Skin: Skin is warm and dry. No rash noted. No erythema.  No ecchymosis or other evidence of bruising. No local swelling.  Psychiatric: His mood appears anxious.  Nursing note and vitals reviewed.   Urgent Care Course     Procedures (including critical care time)  Labs Review Labs Reviewed - No data to display  Imaging Review No results found.   Visual Acuity Review  Right Eye Distance:   Left Eye Distance:   Bilateral Distance:    Right Eye Near:   Left Eye Near:    Bilateral Near:         MDM   1. Muscle strain of anterior chest wall   2. Muscle strain of left shoulder region, initial encounter   3. Rib tenderness    There is unequivocal tenderness to the chest wall musculature as well as the dusky nature of the trapezius in that superior to the scapula. These are the areas of pain that are exacerbated with movement or deep breath. Lungs are perfectly clear in all areas. Definitely no  adventitious sounds. Unable to explain the mechanism of injury to the left shoulder musculature. For the first day or 2 use cold compresses or ice to the area. After that use heat to the muscles. If you develop any new symptoms particularly shortness of breath, chest pain such as heaviness, tightness, pressure or fullness, pain in the middle of the back, sudden sweating, feeling fainty or other similar worrisome problems call 911 or go to the emergency department promptly.     Hayden Rasmussen, NP 08/27/16 [phone]

## 2017-06-14 IMAGING — CT CT RENAL STONE PROTOCOL
2 of 4 series · 15 of 46 positions shown, 17 images · non-contrast
Comparison: None.

CLINICAL DATA: Left flank pain for 2 hours. History of kidney
stones.

EXAM:
CT ABDOMEN AND PELVIS WITHOUT CONTRAST
TECHNIQUE: Multidetector CT imaging of the abdomen and pelvis was performed
following the standard protocol without IV contrast.

[Series 2: stone study 5.0 i30f 1 · axial · 0.78mm/px · z∈[-1031,-571]mm · 12 of 106 slices shown, 14 images]
[im 9/106  soft-tissue]
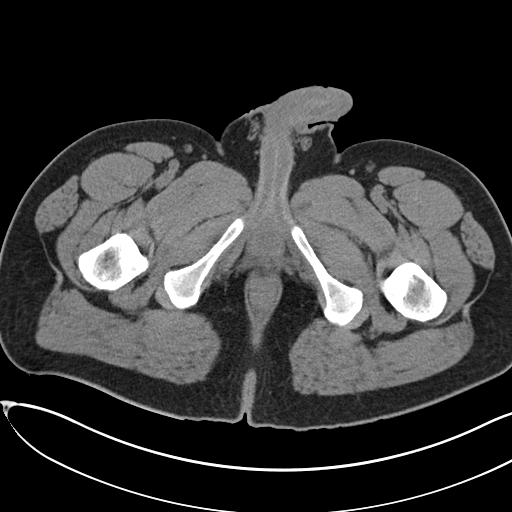
[im 9/106  bone]
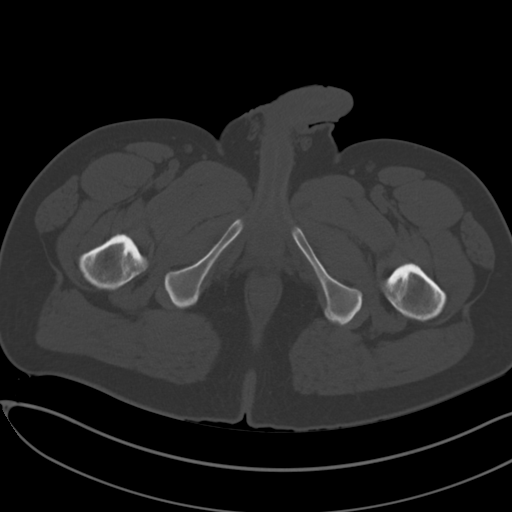
[im 17/106  soft-tissue]
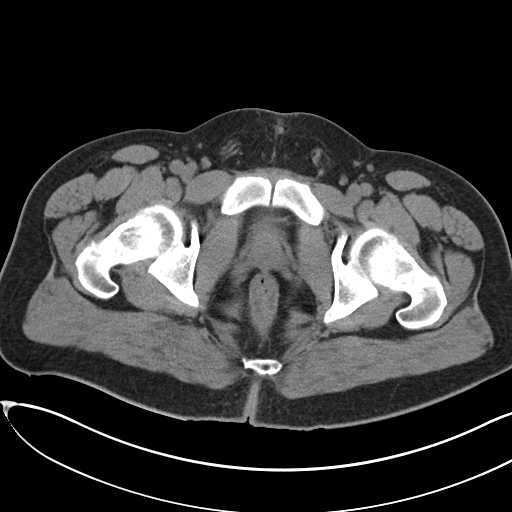
[im 26/106  soft-tissue]
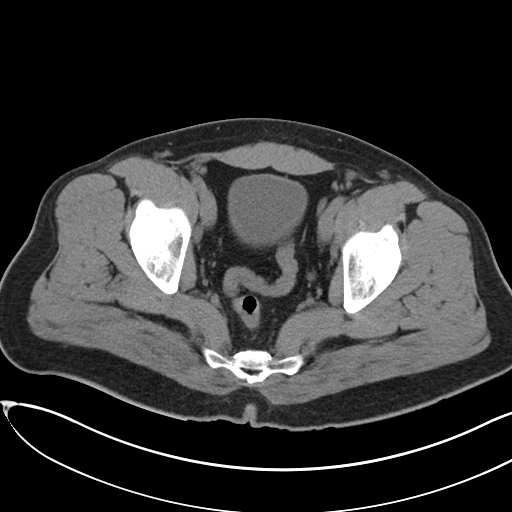
[im 34/106  soft-tissue]
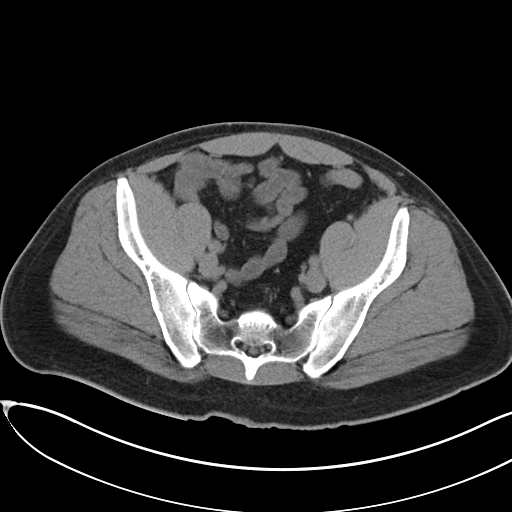
[im 43/106  soft-tissue]
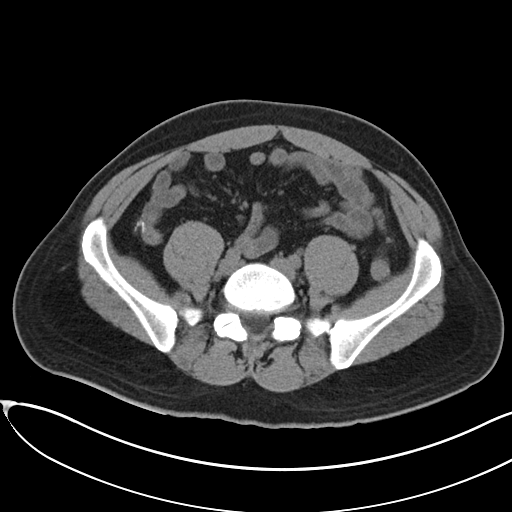
[im 51/106  soft-tissue]
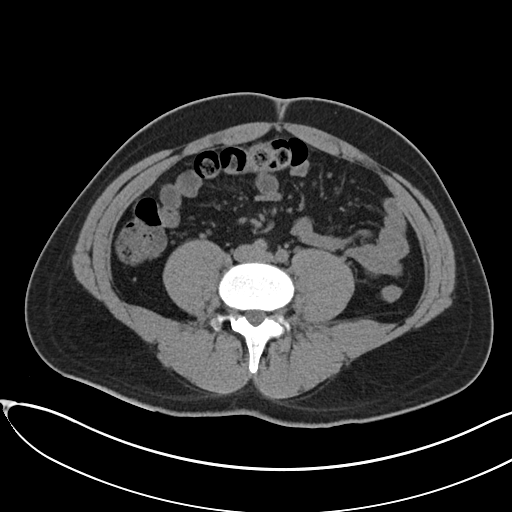
[im 59/106  soft-tissue]
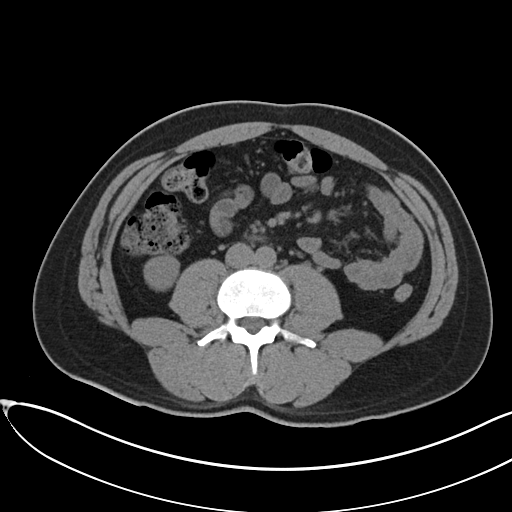
[im 68/106  soft-tissue]
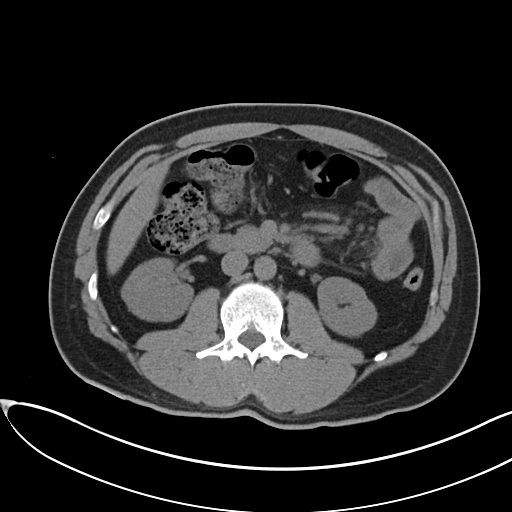
[im 76/106  soft-tissue]
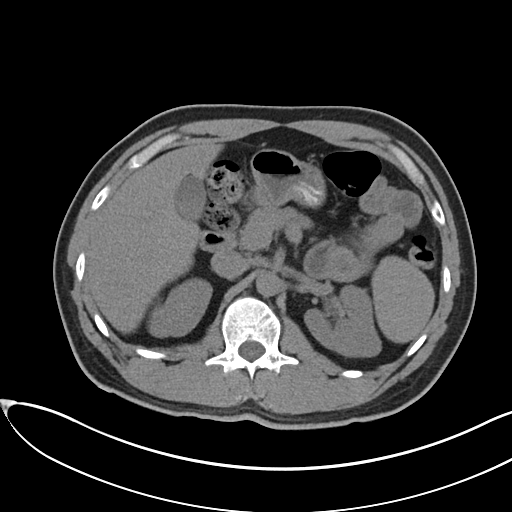
[im 76/106  bone]
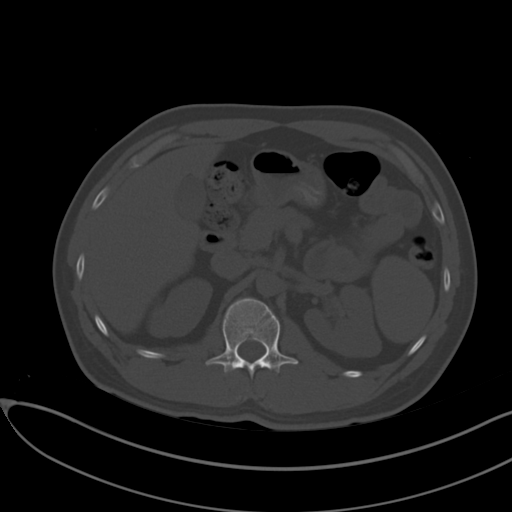
[im 85/106  soft-tissue]
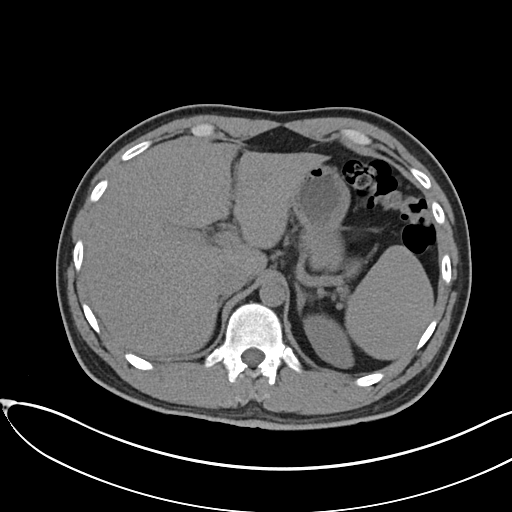
[im 93/106  soft-tissue]
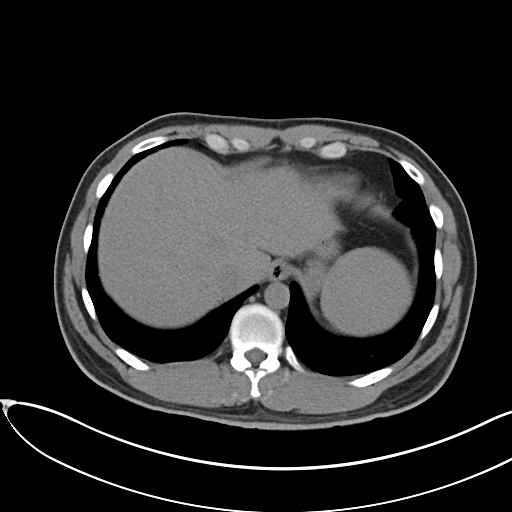
[im 101/106  soft-tissue]
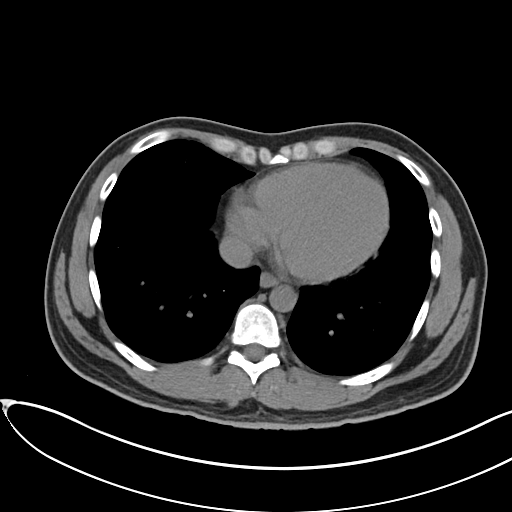

[Series 5: coronal soft tissue · coronal · 0.78mm/px · 3 of 91 slices shown]
[im 31/91  soft-tissue]
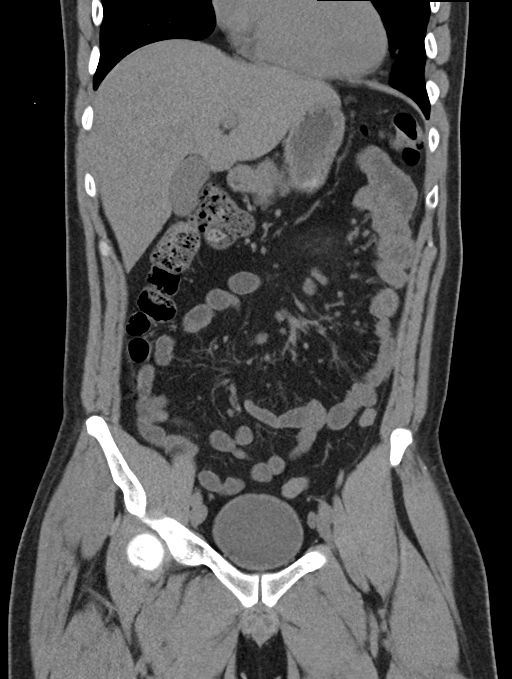
[im 41/91  soft-tissue]
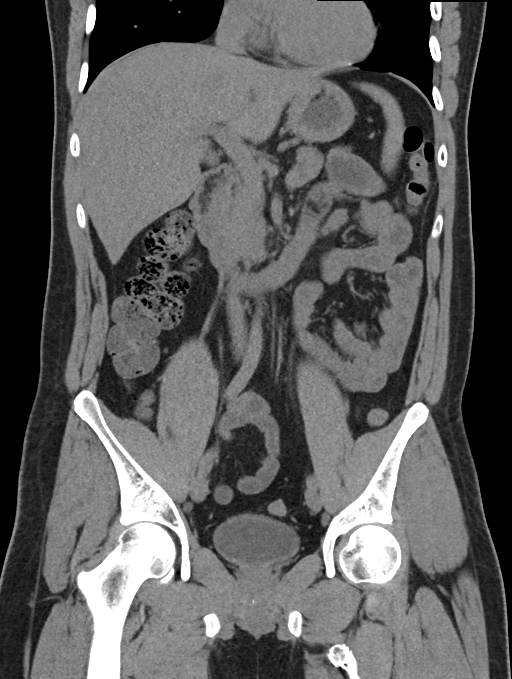
[im 51/91  soft-tissue]
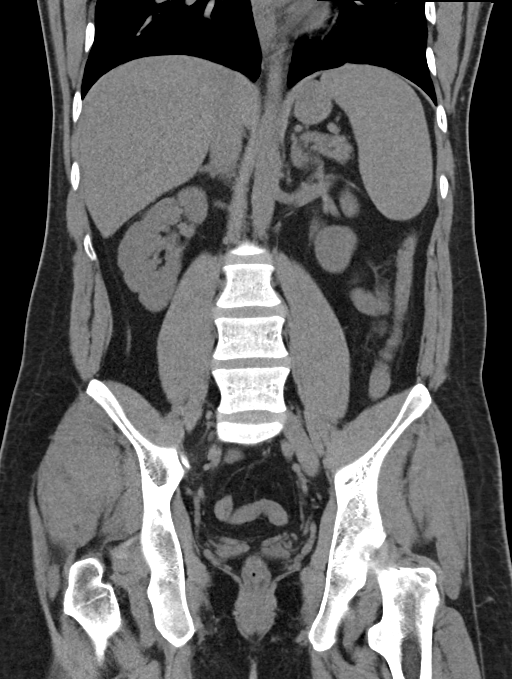

[15 of 46 positions shown; findings below may reference images not displayed]

FINDINGS: Lower chest:  The included lung bases are clear.

Liver: No focal lesion allowing for lack contrast.

Hepatobiliary: Gallbladder physiologically distended, no calcified
stone. No biliary dilatation.

Pancreas: No ductal dilatation or inflammation.

Spleen: Normal.

Adrenal glands: Left adrenal thickening without discrete nodule.
Right adrenal gland is normal.

Kidneys: There is a punctate obstructing stone at the left
ureterovesicular junction with mild proximal hydroureteronephrosis.
Minimal perinephric stranding. Punctate nonobstructing stone in the
interpolar left kidney. Questionable punctate stone in the lower
right kidney. No right obstructive uropathy. Right ureter is
decompressed.

Stomach/Bowel: Stomach physiologically distended. No bowel
obstruction. Within the left mid abdomen is a single prominent
proximal jejunal bowel loop with internal low-density. Small volume
of stool throughout the colon without colonic wall thickening. The
appendix is surgically absent.

Vascular/Lymphatic: There are multiple small mesenteric lymph nodes
in the left mid abdomen, largest measuring 2.3 x 0.9 cm. Haziness in
the left upper quadrant mesentery in the region of increased lymph
nodes. No retroperitoneal adenopathy. Abdominal aorta is normal in
caliber.

Reproductive: Normal sized prostate gland.

Bladder: Physiologically distended, no wall thickening.

Other: No free air, free fluid, or intra-abdominal fluid collection.

Musculoskeletal: There are no acute or suspicious osseous
abnormalities. Ossification within the left adductor musculature
consistent with remote prior injury.
IMPRESSION: 1. Obstructing punctate stone at the left ureterovesicular junction
with mild proximal hydroureteronephrosis.
2. Additional punctate bilateral nonobstructing nephrolithiasis.
3. Prominent mesenteric lymph nodes and haziness in the adjacent
mesentery in the left mid abdomen. There is an adjacent prominent
proximal jejunal bowel loop with internal low-density, which may be
simply ingested contents, however the adjacent lymph nodes raises
concern for underlying infectious or potentially neoplastic
etiology. Comparison with any prior imaging would be most helpful to
evaluate for chronicity. In the absence of prior imaging, recommend
a follow-up CT in 2-3 months, WITH oral contrast.

## 2017-11-19 ENCOUNTER — Ambulatory Visit (HOSPITAL_COMMUNITY)
Admission: EM | Admit: 2017-11-19 | Discharge: 2017-11-19 | Disposition: A | Discharge status: Home / Self Care | Payer: BLUE CROSS/BLUE SHIELD | Attending: Family Medicine | Admitting: Family Medicine

## 2017-11-19 ENCOUNTER — Encounter (HOSPITAL_COMMUNITY): Payer: Self-pay | Admitting: Emergency Medicine

## 2017-11-19 DIAGNOSIS — M940 Chondrocostal junction syndrome [Tietze]: Secondary | ICD-10-CM

## 2017-11-19 MED ORDER — PREDNISONE 20 MG PO TABS: 20.0000 mg | ORAL_TABLET | Freq: Two times a day (BID) | ORAL | 0 refills | Status: DC

## 2017-11-19 MED ORDER — IBUPROFEN 800 MG PO TABS: 800.0000 mg | ORAL_TABLET | Freq: Three times a day (TID) | ORAL | 0 refills | Status: DC

## 2017-11-19 NOTE — ED Provider Notes (Signed)
[facility]    CSN: [id number] Arrival date & time: 11/19/17  1844     History   Chief Complaint Chief Complaint  Patient presents with  . rib pain    HPI David Osborn is a 32 y.o. male.   HPI This is a healthy 32 year old.  Takes Zocor for hyperlipidemia.  Otherwise as needed albuterol if he wheezes, past history of asthma. He is here for chest wall pain.  Is been hurting in his left ribs lower edge below chest for the last week.  Is getting worse.  Hurts with deep breath and cough.  It hurts with certain arm movement.  He does not recall any accident, injury, or overuse.  No recent cold or cough symptoms.  No recent vomiting.  Is never had problems with his ribs before.  He did have community-acquired pneumonia in 2017.  This feels different and that he does not have a fever cough and congestion.  No central chest pain, exertional chest pain, cardiac symptoms. Past Medical History:  Diagnosis Date  . Hyperlipidemia   . Nephrolithiasis     Patient Active Problem List   Diagnosis Date Noted  . Ureterolithiasis 11/06/2015    Past Surgical History:  Procedure Laterality Date  . APPENDECTOMY  2009       Home Medications    Prior to Admission medications   Medication Sig Start Date End Date Taking? Authorizing Provider  albuterol (PROVENTIL HFA;VENTOLIN HFA) 108 (90 Base) MCG/ACT inhaler Inhale 1-2 puffs into the lungs every 6 (six) hours as needed for wheezing or shortness of breath. 07/08/16   Deatra Canterxford, William J, FNP  ibuprofen (ADVIL,MOTRIN) 800 MG tablet Take 1 tablet (800 mg total) by mouth 3 (three) times daily. 11/19/17   Eustace MooreNelson, Kirt Chew Sue, MD  predniSONE (DELTASONE) 20 MG tablet Take 1 tablet (20 mg total) by mouth 2 (two) times daily with a meal. 11/19/17   Eustace MooreNelson, Haven Pylant Sue, MD  simvastatin (ZOCOR) 10 MG tablet Take 10 mg by mouth at bedtime.    [provider]    Family History Family History  Problem Relation Age of Onset  . Colon  cancer Paternal Grandfather   . Colon polyps Paternal Grandfather   . Prostate cancer Paternal Grandfather   . Heart disease Paternal Grandfather   . Kidney nephrosis Paternal Grandfather   . Colon polyps Father   . Kidney nephrosis Father   . Diabetes Mother   . Diabetes Maternal Grandmother   . Heart disease Maternal Grandmother     Social History Social History   Tobacco Use  . Smoking status: Never Smoker  . Smokeless tobacco: Never Used  Substance Use Topics  . Alcohol use: Yes    Alcohol/week: 0.0 oz    Comment: social  . Drug use: No     Allergies   Augmentin [amoxicillin-pot clavulanate]; Ceclor [cefaclor]; and Codeine   Review of Systems Review of Systems  Constitutional: Negative for chills and fever.  HENT: Negative for ear pain and sore throat.   Eyes: Negative for pain and visual disturbance.  Respiratory: Negative for cough and shortness of breath.   Cardiovascular: Positive for chest pain. Negative for palpitations.  Gastrointestinal: Negative for abdominal pain and vomiting.  Genitourinary: Negative for dysuria and hematuria.  Musculoskeletal: Negative for arthralgias and back pain.  Skin: Negative for color change and rash.  Neurological: Negative for seizures and syncope.  All other systems reviewed and are negative.    Physical Exam Triage Vital Signs ED  Triage Vitals [11/19/17 1855]  Enc Vitals Group     BP 133/74     Pulse Rate 99     Resp 18     Temp 97.8 F (36.6 C)     Temp Source Oral     SpO2 95 %     Weight      Height      Head Circumference      Peak Flow      Pain Score      Pain Loc      Pain Edu?      Excl. in GC?    No data found.  Updated Vital Signs BP 133/74 (BP Location: Left Arm)   Pulse 99   Temp 97.8 F (36.6 C) (Oral)   Resp 18   SpO2 95%   Visual Acuity Right Eye Distance:   Left Eye Distance:   Bilateral Distance:    Right Eye Near:   Left Eye Near:    Bilateral Near:     Physical Exam    Constitutional: He appears well-developed and well-nourished. No distress.  HENT:  Head: Normocephalic and atraumatic.  Right Ear: External ear normal.  Left Ear: External ear normal.  Mouth/Throat: Oropharynx is clear and moist.  Eyes: Pupils are equal, round, and reactive to light. Conjunctivae are normal.  Neck: Normal range of motion. Neck supple.  Cardiovascular: Normal rate, regular rhythm and normal heart sounds.  Pulmonary/Chest: Effort normal. No respiratory distress. He has no wheezes. He has no rales.  Tenderness to even moderate pressure of the lower rib border underneath the left breast.  Abdominal: Soft. He exhibits no distension.  Musculoskeletal: Normal range of motion. He exhibits no edema.  Neurological: He is alert.  Skin: Skin is warm and dry.  Psychiatric: He has a normal mood and affect. His behavior is normal.     UC Treatments / Results  Labs (all labs ordered are listed, but only abnormal results are displayed) Labs Reviewed - No data to display  EKG None  Radiology No results found.  Procedures Procedures (including critical care time)  Medications Ordered in UC Medications - No data to display  Initial Impression / Assessment and Plan / UC Course  I have reviewed the triage vital signs and the nursing notes.  Pertinent labs & imaging results that were available during my care of the patient were reviewed by me and considered in my medical decision making (see chart for details).     Discussed costochondritis, chest wall strain.  No apparent injury.  Treatments anti-inflammatories rest and ice.  Watch for pneumonia. Final Clinical Impressions(s) / UC Diagnoses   Final diagnoses:  Costochondritis     Discharge Instructions     Try to limit activities that cause pain No heavy lifting Take the prednisone for 5 days Take the ibuprofen after this Take with food    ED Prescriptions    Medication Sig Dispense Auth. Provider    predniSONE (DELTASONE) 20 MG tablet Take 1 tablet (20 mg total) by mouth 2 (two) times daily with a meal. 10 tablet Eustace Moore, MD   ibuprofen (ADVIL,MOTRIN) 800 MG tablet Take 1 tablet (800 mg total) by mouth 3 (three) times daily. 21 tablet Eustace Moore, MD     Controlled Substance Prescriptions [place] Controlled Substance Registry consulted? Not Applicable   Eustace Moore, MD 11/19/17 2105

## 2017-11-19 NOTE — Discharge Instructions (Signed)
Try to limit activities that cause pain No heavy lifting Take the prednisone for 5 days Take the ibuprofen after this Take with food

## 2017-11-19 NOTE — ED Triage Notes (Signed)
Pt sts left sided rib pain x 1 week worse with movement and inspiration

## 2018-10-13 ENCOUNTER — Other Ambulatory Visit: Payer: Self-pay | Admitting: Family Medicine

## 2018-10-13 DIAGNOSIS — N200 Calculus of kidney: Secondary | ICD-10-CM

## 2018-10-24 ENCOUNTER — Other Ambulatory Visit: Payer: Self-pay

## 2018-10-24 ENCOUNTER — Ambulatory Visit
Admission: RE | Admit: 2018-10-24 | Discharge: 2018-10-24 | Disposition: A | Discharge status: Home / Self Care | Payer: BLUE CROSS/BLUE SHIELD | Source: Ambulatory Visit | Attending: Family Medicine | Admitting: Family Medicine

## 2018-10-24 DIAGNOSIS — N200 Calculus of kidney: Secondary | ICD-10-CM

## 2019-05-09 ENCOUNTER — Other Ambulatory Visit: Payer: Self-pay | Admitting: Family Medicine

## 2019-05-09 DIAGNOSIS — M5431 Sciatica, right side: Secondary | ICD-10-CM

## 2019-05-09 NOTE — Progress Notes (Signed)
Pt w/ sciatica for several months. Treated w/ home PT, prednisone, Robaxin and Dry needling/trigger point injections.  Lumbar film pending Referring to PT for formal evaluation and treatment.   Linna Darner, MD Family Medicine 05/09/2019, 4:20 PM

## 2019-05-16 ENCOUNTER — Other Ambulatory Visit: Payer: Self-pay

## 2019-05-16 ENCOUNTER — Ambulatory Visit
Admission: RE | Admit: 2019-05-16 | Discharge: 2019-05-16 | Disposition: A | Discharge status: Home / Self Care | Payer: BLUE CROSS/BLUE SHIELD | Source: Ambulatory Visit | Attending: Family Medicine | Admitting: Family Medicine

## 2019-05-16 DIAGNOSIS — M5431 Sciatica, right side: Secondary | ICD-10-CM

## 2019-08-12 ENCOUNTER — Ambulatory Visit: Payer: BC Managed Care – PPO

## 2019-08-28 ENCOUNTER — Ambulatory Visit
Admission: EM | Admit: 2019-08-28 | Discharge: 2019-08-28 | Disposition: A | Discharge status: Home / Self Care | Payer: BC Managed Care – PPO | Attending: Urgent Care | Admitting: Urgent Care

## 2019-08-28 DIAGNOSIS — L299 Pruritus, unspecified: Secondary | ICD-10-CM

## 2019-08-28 DIAGNOSIS — L237 Allergic contact dermatitis due to plants, except food: Secondary | ICD-10-CM

## 2019-08-28 MED ORDER — HYDROXYZINE HCL 25 MG PO TABS: 12.5000 mg | ORAL_TABLET | Freq: Three times a day (TID) | ORAL | 0 refills | Status: DC | PRN

## 2019-08-28 MED ORDER — PREDNISONE 20 MG PO TABS: ORAL_TABLET | ORAL | 0 refills | Status: DC

## 2019-08-28 NOTE — ED Triage Notes (Signed)
Pt was exposed to poison ivy approx 1.5 weeks ago.  Reports worsening, even with home remedies of rubbing alcohol, desitin.  Has patches on arms and right leg.

## 2019-08-28 NOTE — ED Provider Notes (Signed)
David Osborn   MRN: [id number] DOB: 1985-11-28  Subjective:   David Osborn is a 34 y.o. male presenting for 1.5 week hx of acute onset pruritic rash that is spreading to his hands and lower legs.  Patient has been using rubbing alcohol, topical creams.  Rash is not getting better especially over his forearms.  Denies chest tightness, shortness of breath or wheezing, facial swelling, oral swelling, genital involvement of the rash.  No current facility-administered medications for this encounter.  Current Outpatient Medications:  .  ibuprofen (ADVIL,MOTRIN) 800 MG tablet, Take 1 tablet (800 mg total) by mouth 3 (three) times daily., Disp: 21 tablet, Rfl: 0   Allergies  Allergen Reactions  . Augmentin [Amoxicillin-Pot Clavulanate]   . Ceclor [Cefaclor]   . Codeine     Past Medical History:  Diagnosis Date  . Hyperlipidemia   . Nephrolithiasis      Past Surgical History:  Procedure Laterality Date  . APPENDECTOMY  2009    Family History  Problem Relation Age of Onset  . Colon cancer Paternal Grandfather   . Colon polyps Paternal Grandfather   . Prostate cancer Paternal Grandfather   . Heart disease Paternal Grandfather   . Kidney nephrosis Paternal Grandfather   . Colon polyps Father   . Kidney nephrosis Father   . Diabetes Mother   . Diabetes Maternal Grandmother   . Heart disease Maternal Grandmother     Social History   Tobacco Use  . Smoking status: Never Smoker  . Smokeless tobacco: Never Used  Substance Use Topics  . Alcohol use: Yes    Alcohol/week: 0.0 standard drinks    Comment: social  . Drug use: No    ROS   Objective:   Vitals: BP 113/72   Pulse 69   Temp 98 F (36.7 C) (Oral)   Resp 18   SpO2 98%   Physical Exam Constitutional:      General: He is not in acute distress.    Appearance: Normal appearance. He is well-developed and normal weight. He is not ill-appearing, toxic-appearing or diaphoretic.  HENT:     Head:  Normocephalic and atraumatic.     Right Ear: External ear normal.     Left Ear: External ear normal.     Nose: Nose normal.     Mouth/Throat:     Pharynx: Oropharynx is clear. No oropharyngeal exudate or posterior oropharyngeal erythema.     Comments: Airways patent. Eyes:     General: No scleral icterus.       Right eye: No discharge.        Left eye: No discharge.     Extraocular Movements: Extraocular movements intact.     Pupils: Pupils are equal, round, and reactive to light.  Cardiovascular:     Rate and Rhythm: Normal rate.  Pulmonary:     Effort: Pulmonary effort is normal.  Musculoskeletal:     Cervical back: Normal range of motion.  Skin:    General: Skin is warm and dry.     Findings: Rash present.  Neurological:     Mental Status: He is alert and oriented to person, place, and time.  Psychiatric:        Mood and Affect: Mood normal.        Behavior: Behavior normal.        Thought Content: Thought content normal.        Judgment: Judgment normal.  Assessment and Plan :   1. Poison ivy dermatitis   2. Itching     Start prednisone course for poison ivy dermatitis, hydroxyzine for itching. Counseled patient on potential for adverse effects with medications prescribed/recommended today, ER and return-to-clinic precautions discussed, patient verbalized understanding.    Jaynee Eagles, [place] 08/28/19 1221

## 2019-08-28 NOTE — Discharge Instructions (Addendum)
Please do your best to keep the areas dry and free of friction.  Avoid using any alcohol based products.  It is okay to use calamine lotion or antihistamine creams.

## 2021-05-05 ENCOUNTER — Ambulatory Visit
Admission: RE | Admit: 2021-05-05 | Discharge: 2021-05-05 | Disposition: A | Discharge status: Home / Self Care | Payer: 59 | Source: Ambulatory Visit | Attending: Family Medicine | Admitting: Family Medicine

## 2021-05-05 ENCOUNTER — Other Ambulatory Visit: Payer: Self-pay

## 2021-05-05 ENCOUNTER — Other Ambulatory Visit: Payer: Self-pay | Admitting: Family Medicine

## 2021-05-05 DIAGNOSIS — R051 Acute cough: Secondary | ICD-10-CM

## 2022-04-12 ENCOUNTER — Ambulatory Visit
Admission: EM | Admit: 2022-04-12 | Discharge: 2022-04-12 | Disposition: A | Discharge status: Home / Self Care | Payer: 59 | Attending: Emergency Medicine | Admitting: Emergency Medicine

## 2022-04-12 ENCOUNTER — Ambulatory Visit (INDEPENDENT_AMBULATORY_CARE_PROVIDER_SITE_OTHER): Payer: 59

## 2022-04-12 DIAGNOSIS — M79672 Pain in left foot: Secondary | ICD-10-CM | POA: Diagnosis not present

## 2022-04-12 MED ORDER — IBUPROFEN 600 MG PO TABS: 600.0000 mg | ORAL_TABLET | Freq: Four times a day (QID) | ORAL | 0 refills | Status: DC | PRN

## 2022-04-12 NOTE — Discharge Instructions (Addendum)
Take the ibuprofen as prescribed.  Rest and elevate your foot.  Apply ice packs 2-3 times a day for up to 20 minutes each.      Follow up with an orthopedist if your symptoms are not improving.    

## 2022-04-12 NOTE — ED Triage Notes (Signed)
Patient to Urgent Care with complaints of left foot pain x2 weeks. Reports the pain is in the arch of his foot. Reports running yesterday and pain is significantly worse.  Reports possibly injuring it when running.

## 2022-04-12 NOTE — ED Provider Notes (Signed)
[facility]    CSN: [id number] Arrival date & time: 04/12/22  1436      History   Chief Complaint Chief Complaint  Patient presents with   Foot Pain    HPI David Osborn is a 36 y.o. male.  Patient presents with 2 week history of left foot pain in the arch of his foot.  The pain is worse since running yesterday.  No known trauma.  Treatment at home with Tylenol.  He has a bruise in the area of pain.  No open wound, redness, fever, chills, numbness, weakness, paresthesias, or other symptoms.    The history is provided by the patient and medical records.    Past Medical History:  Diagnosis Date   Hyperlipidemia    Nephrolithiasis     Patient Active Problem List   Diagnosis Date Noted   Ureterolithiasis 11/06/2015    Past Surgical History:  Procedure Laterality Date   APPENDECTOMY  2009       Home Medications    Prior to Admission medications   Medication Sig Start Date End Date Taking? Authorizing Provider  ibuprofen (ADVIL) 600 MG tablet Take 1 tablet (600 mg total) by mouth every 6 (six) hours as needed. 04/12/22  Yes Mickie Bailate, Naheim Burgen H, NP  albuterol (PROVENTIL HFA;VENTOLIN HFA) 108 (90 Base) MCG/ACT inhaler Inhale 1-2 puffs into the lungs every 6 (six) hours as needed for wheezing or shortness of breath. 07/08/16 08/28/19  Deatra Canterxford, William J, FNP  simvastatin (ZOCOR) 10 MG tablet Take 10 mg by mouth at bedtime.  08/28/19  [provider]    Family History Family History  Problem Relation Age of Onset   Colon cancer Paternal Grandfather    Colon polyps Paternal Grandfather    Prostate cancer Paternal Grandfather    Heart disease Paternal Grandfather    Kidney nephrosis Paternal Grandfather    Colon polyps Father    Kidney nephrosis Father    Diabetes Mother    Diabetes Maternal Grandmother    Heart disease Maternal Grandmother     Social History Social History   Tobacco Use   Smoking status: Never   Smokeless tobacco: Never  Substance  Use Topics   Alcohol use: Yes    Alcohol/week: 0.0 standard drinks of alcohol    Comment: social   Drug use: No     Allergies   Augmentin [amoxicillin-pot clavulanate], Ceclor [cefaclor], and Codeine   Review of Systems Review of Systems  Constitutional:  Negative for chills and fever.  Musculoskeletal:  Positive for arthralgias and gait problem. Negative for joint swelling.  Skin:  Positive for color change. Negative for wound.  Neurological:  Negative for weakness and numbness.  All other systems reviewed and are negative.    Physical Exam Triage Vital Signs ED Triage Vitals  Enc Vitals Group     BP      Pulse      Resp      Temp      Temp src      SpO2      Weight      Height      Head Circumference      Peak Flow      Pain Score      Pain Loc      Pain Edu?      Excl. in GC?    No data found.  Updated Vital Signs BP 133/79   Pulse 83   Temp 98.4 F (36.9 C)  Resp 18   Ht 6\' 2"  (1.88 m)   Wt 245 lb (111.1 kg)   SpO2 98%   BMI 31.46 kg/m   Visual Acuity Right Eye Distance:   Left Eye Distance:   Bilateral Distance:    Right Eye Near:   Left Eye Near:    Bilateral Near:     Physical Exam Vitals and nursing note reviewed.  Constitutional:      General: He is not in acute distress.    Appearance: Normal appearance. He is well-developed. He is not ill-appearing.  HENT:     Mouth/Throat:     Mouth: Mucous membranes are moist.  Cardiovascular:     Rate and Rhythm: Normal rate and regular rhythm.     Heart sounds: Normal heart sounds.  Pulmonary:     Effort: Pulmonary effort is normal. No respiratory distress.     Breath sounds: Normal breath sounds.  Musculoskeletal:        General: Tenderness present. No swelling or deformity. Normal range of motion.     Cervical back: Neck supple.       Feet:  Skin:    General: Skin is warm and dry.     Capillary Refill: Capillary refill takes less than 2 seconds.     Findings: Bruising present. No  erythema or lesion.  Neurological:     General: No focal deficit present.     Mental Status: He is alert and oriented to person, place, and time.     Sensory: No sensory deficit.     Motor: No weakness.     Gait: Gait normal.  Psychiatric:        Mood and Affect: Mood normal.        Behavior: Behavior normal.      UC Treatments / Results  Labs (all labs ordered are listed, but only abnormal results are displayed) Labs Reviewed - No data to display  EKG   Radiology DG Foot Complete Left  Result Date: 04/12/2022 CLINICAL DATA:  pain x 2 weeks.  worse after running yesterday EXAM: LEFT FOOT - COMPLETE 3+ VIEW COMPARISON:  None Available. FINDINGS: No acute fracture or dislocation. Joint spaces and alignment are maintained. Enthesopathic changes of the Achilles tendon. No area of erosion or osseous destruction. No unexpected radiopaque foreign body. Soft tissues are unremarkable. IMPRESSION: No acute fracture or dislocation. Electronically Signed   By: 04/14/2022 M.D.   On: 04/12/2022 15:02    Procedures Procedures (including critical care time)  Medications Ordered in UC Medications - No data to display  Initial Impression / Assessment and Plan / UC Course  I have reviewed the triage vital signs and the nursing notes.  Pertinent labs & imaging results that were available during my care of the patient were reviewed by me and considered in my medical decision making (see chart for details).    Left foot pain.  Xray negative.  Treating with ibuprofen, rest, elevation, ice packs.  Instructed patient to follow up with an orthopedist or his PCP if his symptoms are not improving.  Contact information for on-call orthopedist provided.  Education provided on foot pain.  He agrees to plan of care.    Final Clinical Impressions(s) / UC Diagnoses   Final diagnoses:  Left foot pain     Discharge Instructions      Take the ibuprofen as prescribed.  Rest and elevate your  foot.  Apply ice packs 2-3 times a day for up to 20 minutes  each.     Follow up with an orthopedist if your symptoms are not improving.        ED Prescriptions     Medication Sig Dispense Auth. Provider   ibuprofen (ADVIL) 600 MG tablet Take 1 tablet (600 mg total) by mouth every 6 (six) hours as needed. 30 tablet Sharion Balloon, NP      I have reviewed the PDMP during this encounter.   Sharion Balloon, NP 04/12/22 1511

## 2023-11-05 ENCOUNTER — Ambulatory Visit
Admission: EM | Admit: 2023-11-05 | Discharge: 2023-11-05 | Disposition: A | Discharge status: Home / Self Care | Attending: Emergency Medicine | Admitting: Emergency Medicine

## 2023-11-05 ENCOUNTER — Encounter: Payer: Self-pay | Admitting: Emergency Medicine

## 2023-11-05 DIAGNOSIS — J029 Acute pharyngitis, unspecified: Secondary | ICD-10-CM | POA: Diagnosis not present

## 2023-11-05 LAB — POCT RAPID STREP A (OFFICE): Rapid Strep A Screen: NEGATIVE

## 2023-11-05 NOTE — ED Provider Notes (Signed)
 [facility]    CSN: [id number] Arrival date & time: 11/05/23  1357      History   Chief Complaint Chief Complaint  Patient presents with   Sore Throat    HPI Trenton Ohms is a 38 y.o. male.  Patient presents with 1 day history of sore throat and hoarse voice.  No OTC medications taken.  No fever, chills, rash, cough, shortness of breath.  The history is provided by the patient and medical records.    Past Medical History:  Diagnosis Date   Hyperlipidemia    Nephrolithiasis     Patient Active Problem List   Diagnosis Date Noted   Ureterolithiasis 11/06/2015    Past Surgical History:  Procedure Laterality Date   APPENDECTOMY  2009       Home Medications    Prior to Admission medications   Medication Sig Start Date End Date Taking? Authorizing Provider  ibuprofen  (ADVIL ) 600 MG tablet Take 1 tablet (600 mg total) by mouth every 6 (six) hours as needed. 04/12/22   Wellington Half, NP  albuterol  (PROVENTIL  HFA;VENTOLIN  HFA) 108 (90 Base) MCG/ACT inhaler Inhale 1-2 puffs into the lungs every 6 (six) hours as needed for wheezing or shortness of breath. 07/08/16 08/28/19  Doc Freed, FNP  simvastatin (ZOCOR) 10 MG tablet Take 10 mg by mouth at bedtime.  08/28/19  [provider]    Family History Family History  Problem Relation Age of Onset   Colon cancer Paternal Grandfather    Colon polyps Paternal Grandfather    Prostate cancer Paternal Grandfather    Heart disease Paternal Grandfather    Kidney nephrosis Paternal Grandfather    Colon polyps Father    Kidney nephrosis Father    Diabetes Mother    Diabetes Maternal Grandmother    Heart disease Maternal Grandmother     Social History Social History   Tobacco Use   Smoking status: Never   Smokeless tobacco: Never  Vaping Use   Vaping status: Never Used  Substance Use Topics   Alcohol use: Yes    Alcohol/week: 0.0 standard drinks of alcohol    Comment: social   Drug use: No      Allergies   Augmentin [amoxicillin-pot clavulanate], Ceclor [cefaclor], and Codeine   Review of Systems Review of Systems  Constitutional:  Negative for chills and fever.  HENT:  Positive for sore throat and voice change. Negative for ear pain.   Respiratory:  Negative for cough and shortness of breath.      Physical Exam Triage Vital Signs ED Triage Vitals  Encounter Vitals Group     BP 11/05/23 1414 112/75     Girls Systolic BP Percentile --      Girls Diastolic BP Percentile --      Boys Systolic BP Percentile --      Boys Diastolic BP Percentile --      Pulse Rate 11/05/23 1414 97     Resp 11/05/23 1414 18     Temp 11/05/23 1414 97.8 F (36.6 C)     Temp Source 11/05/23 1414 Oral     SpO2 11/05/23 1414 97 %     Weight --      Height --      Head Circumference --      Peak Flow --      Pain Score 11/05/23 1410 6     Pain Loc --      Pain Education --  Exclude from Growth Chart --    No data found.  Updated Vital Signs BP 112/75 (BP Location: Left Arm)   Pulse 97   Temp 97.8 F (36.6 C) (Oral)   Resp 18   SpO2 97%   Visual Acuity Right Eye Distance:   Left Eye Distance:   Bilateral Distance:    Right Eye Near:   Left Eye Near:    Bilateral Near:     Physical Exam Constitutional:      General: He is not in acute distress. HENT:     Right Ear: Tympanic membrane normal.     Left Ear: Tympanic membrane normal.     Nose: Nose normal.     Mouth/Throat:     Mouth: Mucous membranes are moist.     Pharynx: Posterior oropharyngeal erythema present.   Cardiovascular:     Rate and Rhythm: Normal rate and regular rhythm.     Heart sounds: Normal heart sounds.  Pulmonary:     Effort: Pulmonary effort is normal. No respiratory distress.     Breath sounds: Normal breath sounds.   Neurological:     Mental Status: He is alert.      UC Treatments / Results  Labs (all labs ordered are listed, but only abnormal results are displayed) Labs  Reviewed  POCT RAPID STREP A (OFFICE) - Normal    EKG   Radiology No results found.  Procedures Procedures (including critical care time)  Medications Ordered in UC Medications - No data to display  Initial Impression / Assessment and Plan / UC Course  I have reviewed the triage vital signs and the nursing notes.  Pertinent labs & imaging results that were available during my care of the patient were reviewed by me and considered in my medical decision making (see chart for details).    Sore throat, viral pharyngitis.  Rapid strep negative.  Discussed symptomatic treatment including Tylenol  or ibuprofen .  Education provided on sore throat.  Instructed patient to follow-up with his PCP if he is not improving.  He agrees to plan of care.  Final Clinical Impressions(s) / UC Diagnoses   Final diagnoses:  Sore throat  Viral pharyngitis     Discharge Instructions      Strep test is negative.  Take Tylenol  or ibuprofen  as needed for fever or discomfort.  Follow-up with your primary care provider if your symptoms are not improving.      ED Prescriptions   None    PDMP not reviewed this encounter.   Wellington Half, NP 11/05/23 [phone]

## 2023-11-05 NOTE — Discharge Instructions (Addendum)
 Strep test is negative.  Take Tylenol  or ibuprofen  as needed for fever or discomfort.  Follow-up with your primary care provider if your symptoms are not improving.

## 2023-11-05 NOTE — ED Triage Notes (Signed)
 Patient complains of sore throat that started yesterday. Patient has not taken anything for symptoms. Rates pain 6/10.

## 2023-12-26 ENCOUNTER — Emergency Department (HOSPITAL_COMMUNITY)
Admission: EM | Admit: 2023-12-26 | Discharge: 2023-12-27 | Disposition: A | Discharge status: Home / Self Care | Attending: Student | Admitting: Student

## 2023-12-26 ENCOUNTER — Other Ambulatory Visit: Payer: Self-pay

## 2023-12-26 ENCOUNTER — Encounter (HOSPITAL_COMMUNITY): Payer: Self-pay | Admitting: *Deleted

## 2023-12-26 DIAGNOSIS — M25512 Pain in left shoulder: Secondary | ICD-10-CM | POA: Diagnosis present

## 2023-12-26 DIAGNOSIS — E876 Hypokalemia: Secondary | ICD-10-CM | POA: Diagnosis not present

## 2023-12-26 NOTE — ED Triage Notes (Signed)
 The pt is c/o lt shoulder and lt arm pain since 2200   tonight no previous history

## 2023-12-27 ENCOUNTER — Encounter (HOSPITAL_COMMUNITY): Payer: Self-pay | Admitting: Emergency Medicine

## 2023-12-27 ENCOUNTER — Emergency Department (HOSPITAL_COMMUNITY)

## 2023-12-27 LAB — CBC
HCT: 42 % (ref 39.0–52.0)
Hemoglobin: 14.5 g/dL (ref 13.0–17.0)
MCH: 29.8 pg (ref 26.0–34.0)
MCHC: 34.5 g/dL (ref 30.0–36.0)
MCV: 86.2 fL (ref 80.0–100.0)
Platelets: 353 K/uL (ref 150–400)
RBC: 4.87 MIL/uL (ref 4.22–5.81)
RDW: 13 % (ref 11.5–15.5)
WBC: 8.2 K/uL (ref 4.0–10.5)
nRBC: 0 % (ref 0.0–0.2)

## 2023-12-27 LAB — TROPONIN I (HIGH SENSITIVITY)
Troponin I (High Sensitivity): 4 ng/L (ref <18)
Troponin I (High Sensitivity): 4 ng/L (ref <18)

## 2023-12-27 LAB — BASIC METABOLIC PANEL WITH GFR
Anion gap: 13 (ref 5–15)
BUN: 16 mg/dL (ref 6–20)
CO2: 24 mmol/L (ref 22–32)
Calcium: 9.8 mg/dL (ref 8.9–10.3)
Chloride: 101 mmol/L (ref 98–111)
Creatinine, Ser: 1.06 mg/dL (ref 0.61–1.24)
GFR, Estimated: >60 mL/min (ref >60)
Glucose, Bld: 111 mg/dL — ABNORMAL HIGH (ref 70–99)
Potassium: 3.4 mmol/L — ABNORMAL LOW (ref 3.5–5.1)
Sodium: 138 mmol/L (ref 135–145)

## 2023-12-27 MED ORDER — FENTANYL CITRATE PF 50 MCG/ML IJ SOSY: 50.0000 ug | PREFILLED_SYRINGE | Freq: Once | INTRAMUSCULAR | Status: DC

## 2023-12-27 MED ORDER — ASPIRIN 81 MG PO CHEW
324.0000 mg | CHEWABLE_TABLET | Freq: Once | ORAL | Status: AC
  Administered 2023-12-27: 324 mg via ORAL
  Filled 2023-12-27: qty 4

## 2023-12-27 MED ORDER — IBUPROFEN 600 MG PO TABS: 600.0000 mg | ORAL_TABLET | Freq: Four times a day (QID) | ORAL | 0 refills | Status: DC | PRN

## 2023-12-27 MED ORDER — FENTANYL CITRATE PF 50 MCG/ML IJ SOSY
50.0000 ug | PREFILLED_SYRINGE | Freq: Once | INTRAMUSCULAR | Status: AC
  Administered 2023-12-27: 50 ug via INTRAMUSCULAR
  Filled 2023-12-27: qty 1

## 2023-12-27 MED ORDER — POTASSIUM CHLORIDE CRYS ER 20 MEQ PO TBCR
40.0000 meq | EXTENDED_RELEASE_TABLET | Freq: Once | ORAL | Status: AC
  Administered 2023-12-27: 40 meq via ORAL
  Filled 2023-12-27: qty 2

## 2023-12-27 MED ORDER — CYCLOBENZAPRINE HCL 10 MG PO TABS: 10.0000 mg | ORAL_TABLET | Freq: Three times a day (TID) | ORAL | 0 refills | Status: DC | PRN

## 2023-12-27 NOTE — Discharge Instructions (Signed)
 Your heart markers and tests looked good tonight.  Your symptoms are thought to be musculoskeletal and I recommend that you follow-up with an orthopedic doctor.   Please return if you have new or worsening symptoms.

## 2023-12-27 NOTE — ED Notes (Signed)
 Patient transported to X-ray

## 2023-12-27 NOTE — ED Provider Notes (Signed)
 [facility] Emergency Department Provider Note MRN:  [id number]  Arrival date & time: 12/27/23     Chief Complaint   Shoulder Pain   History of Present Illness   David Osborn is a 38 y.o. year-old male presents to the ED with chief complaint of left arm and shoulder pain that started around 10pm tonight.  Onset was in the shoulder and then started to move into his left jaw and left chest.  States that he took his BP and it was in the 160s, which is abnormal for him.  States that he still has some tension in the neck and shoulder.  States that pain was a 7/10, now is a 3/10.  Hasn't taken anything for the pain.  Denies any injuries.    History provided by patient.   Review of Systems  Pertinent positive and negative review of systems noted in HPI.    Physical Exam   Vitals:   12/26/23 2345 12/27/23 0135  BP: (!) 150/84 (!) 147/85  Pulse: 96 80  Resp: 20 16  Temp: (!) 97.5 F (36.4 C)   SpO2: 99% 100%    CONSTITUTIONAL:  non toxic-appearing, NAD NEURO:  Alert and oriented x 3, CN 3-12 grossly intact EYES:  eyes equal and reactive ENT/NECK:  Supple, no stridor  CARDIO:  normal rate, regular rhythm, appears well-perfused  PULM:  No respiratory distress, CTAB GI/GU:  non-distended, no focal tenderness MSK/SPINE:  No gross deformities, no edema, moves all extremities, TTP of the anterior left shoulder, positive hawkins-kennedy impingement test, strength is normal SKIN:  no rash, atraumatic   *Additional and/or pertinent findings included in MDM below  Diagnostic and Interventional Summary    EKG Interpretation Date/Time:    Ventricular Rate:    PR Interval:    QRS Duration:    QT Interval:    QTC Calculation:   R Axis:      Text Interpretation:         Labs Reviewed  BASIC METABOLIC PANEL WITH GFR - Abnormal; Notable for the following components:      Result Value   Potassium 3.4 (*)    Glucose, Bld 111 (*)    All other components  within normal limits  CBC  TROPONIN I (HIGH SENSITIVITY)  TROPONIN I (HIGH SENSITIVITY)    DG Chest 2 View  Final Result      Medications  potassium chloride  SA (KLOR-CON  M) CR tablet 40 mEq (40 mEq Oral Given 12/27/23 0243)  aspirin  chewable tablet 324 mg (324 mg Oral Given 12/27/23 0243)  fentaNYL  (SUBLIMAZE ) injection 50 mcg (50 mcg Intramuscular Given 12/27/23 0243)     Procedures  /  Critical Care Procedures  ED Course and Medical Decision Making  I have reviewed the triage vital signs, the nursing notes, and pertinent available records from the EMR.  Social Determinants Affecting Complexity of Care: Patient has no clinically significant social determinants affecting this chief complaint..   ED Course:    Medical Decision Making Patient here with left shoulder pain.  Onset tonight around 10 PM.  Was associated with elevated blood pressure, which I believe is secondary to the pain.  Patient's pain is easily reproducible with palpation and with movement of the left shoulder joint.  He has positive impingement test on physical exam.  He was concerned about his heart.  He has no cardiac risk factors.  He does not smoke.  No early family history of heart disease.  Chest x-ray is negative.  EKG shows no acute ischemic changes.  Initial troponin is 4, repeat is unchanged.  I doubt ACS.  Doubt PE, he is not hypoxic, not tachycardic.  Pain is nonpleuritic.  Given the reproducibility of the pain, I suspect musculoskeletal source.  Will treat with NSAIDs and muscle relaxant.  Recommend orthopedic follow-up.  Patient vies to return for new or worsening symptoms.  Amount and/or Complexity of Data Reviewed Labs: ordered. Radiology: ordered.  Risk OTC drugs. Prescription drug management.         Consultants: No consultations were needed in caring for this patient.   Treatment and Plan: I considered admission due to patient's initial presentation, but after considering the  examination and diagnostic results, patient will not require admission and can be discharged with outpatient follow-up.    Final Clinical Impressions(s) / ED Diagnoses     ICD-10-CM   1. Acute pain of left shoulder  M25.512       ED Discharge Orders          Ordered    ibuprofen  (ADVIL ) 600 MG tablet  Every 6 hours PRN        12/27/23 0349    cyclobenzaprine  (FLEXERIL ) 10 MG tablet  3 times daily PRN        12/27/23 0349              Discharge Instructions Discussed with and Provided to Patient:     Discharge Instructions      Your heart markers and tests looked good tonight.  Your symptoms are thought to be musculoskeletal and I recommend that you follow-up with an orthopedic doctor.   Please return if you have new or worsening symptoms.       Vicky Charleston, PA-C 12/27/23 9643    Albertina Dixon, MD 12/27/23 [phone]

## 2023-12-30 ENCOUNTER — Ambulatory Visit (INDEPENDENT_AMBULATORY_CARE_PROVIDER_SITE_OTHER)

## 2023-12-30 ENCOUNTER — Other Ambulatory Visit: Payer: Self-pay

## 2023-12-30 ENCOUNTER — Encounter (HOSPITAL_COMMUNITY): Payer: Self-pay

## 2023-12-30 ENCOUNTER — Encounter (HOSPITAL_COMMUNITY): Payer: Self-pay | Admitting: *Deleted

## 2023-12-30 ENCOUNTER — Ambulatory Visit (HOSPITAL_COMMUNITY)
Admission: EM | Admit: 2023-12-30 | Discharge: 2023-12-30 | Disposition: A | Discharge status: Home / Self Care | Attending: Family Medicine | Admitting: Family Medicine

## 2023-12-30 ENCOUNTER — Emergency Department (HOSPITAL_COMMUNITY)
Admission: EM | Admit: 2023-12-30 | Discharge: 2023-12-30 | Disposition: A | Discharge status: Home / Self Care | Attending: Emergency Medicine | Admitting: Emergency Medicine

## 2023-12-30 DIAGNOSIS — R09A2 Foreign body sensation, throat: Secondary | ICD-10-CM | POA: Diagnosis present

## 2023-12-30 DIAGNOSIS — T17408A Unspecified foreign body in trachea causing other injury, initial encounter: Secondary | ICD-10-CM

## 2023-12-30 LAB — BASIC METABOLIC PANEL WITH GFR
Anion gap: 11 (ref 5–15)
BUN: 15 mg/dL (ref 6–20)
CO2: 24 mmol/L (ref 22–32)
Calcium: 9.9 mg/dL (ref 8.9–10.3)
Chloride: 103 mmol/L (ref 98–111)
Creatinine, Ser: 1.02 mg/dL (ref 0.61–1.24)
GFR, Estimated: >60 mL/min (ref >60)
Glucose, Bld: 98 mg/dL (ref 70–99)
Potassium: 3.7 mmol/L (ref 3.5–5.1)
Sodium: 138 mmol/L (ref 135–145)

## 2023-12-30 LAB — CBC
HCT: 44.8 % (ref 39.0–52.0)
Hemoglobin: 15.2 g/dL (ref 13.0–17.0)
MCH: 29.3 pg (ref 26.0–34.0)
MCHC: 33.9 g/dL (ref 30.0–36.0)
MCV: 86.5 fL (ref 80.0–100.0)
Platelets: 344 K/uL (ref 150–400)
RBC: 5.18 MIL/uL (ref 4.22–5.81)
RDW: 12.9 % (ref 11.5–15.5)
WBC: 8.1 K/uL (ref 4.0–10.5)
nRBC: 0 % (ref 0.0–0.2)

## 2023-12-30 MED ORDER — DIAZEPAM 5 MG/ML IJ SOLN
2.5000 mg | Freq: Once | INTRAMUSCULAR | Status: AC
  Administered 2023-12-30: 2.5 mg via INTRAVENOUS
  Filled 2023-12-30: qty 2

## 2023-12-30 MED ORDER — DIAZEPAM 2 MG PO TABS: 2.0000 mg | ORAL_TABLET | Freq: Every evening | ORAL | 0 refills | Status: AC | PRN

## 2023-12-30 MED ORDER — LIDOCAINE HCL (PF) 2% IJ FOR NEBU
5.0000 mL | Freq: Once | RESPIRATORY_TRACT | Status: AC
  Administered 2023-12-30: 5 mL via RESPIRATORY_TRACT
  Filled 2023-12-30: qty 5

## 2023-12-30 MED ORDER — OMEPRAZOLE 20 MG PO CPDR: 20.0000 mg | DELAYED_RELEASE_CAPSULE | Freq: Two times a day (BID) | ORAL | 0 refills | Status: AC

## 2023-12-30 MED ORDER — LIDOCAINE VISCOUS HCL 2 % MT SOLN
15.0000 mL | Freq: Once | OROMUCOSAL | Status: AC
  Administered 2023-12-30: 15 mL via OROMUCOSAL
  Filled 2023-12-30: qty 15

## 2023-12-30 MED ORDER — GLUCAGON HCL RDNA (DIAGNOSTIC) 1 MG IJ SOLR
1.0000 mg | Freq: Once | INTRAMUSCULAR | Status: AC
  Administered 2023-12-30: 1 mg via INTRAVENOUS
  Filled 2023-12-30: qty 1

## 2023-12-30 NOTE — ED Triage Notes (Signed)
 Pt states he ate a burger with pickle last night and thinks pickly got stuck. When to UC this morning and they saw something in throat and referred to ENT. ENT called back after pt had tried to eat something else and said it was still stuck and advised to come to ER. Pt is talking in complete sentences. Not in distress at this time.

## 2023-12-30 NOTE — ED Provider Notes (Signed)
 [place] [facility] AT [facility] Provider Note   CSN: [id number] Arrival date & time: 12/30/23  8261     Patient presents with: No chief complaint on file.   David Osborn is a 38 y.o. male here with foreign body sensation.  Patient reports he ate a hamburger yesterday and says he sucked in a breath and feels a pickle flew into his throat.  He coughed a lot, vomited.  Since then the feeling is stuck in his throat.  He is able to drink fluids but when he tried to eat he felt sick and choking.  No drooling.  +belching.  Went to UC where an xray soft tissue neck raised concern about possible foreign body above the vocal cords, and he was referred into the ED after there was no urgent ENT appointment available.   HPI     Prior to Admission medications   Medication Sig Start Date End Date Taking? Authorizing Provider  diazepam  (VALIUM ) 2 MG tablet Take 1 tablet (2 mg total) by mouth at bedtime as needed for up to 10 doses for anxiety. 12/30/23  Yes Trino Higinbotham, Donnice PARAS, MD  omeprazole  (PRILOSEC) 20 MG capsule Take 1 capsule (20 mg total) by mouth 2 (two) times daily before a meal for 7 days. 12/30/23 01/06/24 Yes Shaneequa Bahner, Donnice PARAS, MD  albuterol  (PROVENTIL  HFA;VENTOLIN  HFA) 108 (90 Base) MCG/ACT inhaler Inhale 1-2 puffs into the lungs every 6 (six) hours as needed for wheezing or shortness of breath. 07/08/16 08/28/19  Pennie Elsie PARAS, FNP  simvastatin (ZOCOR) 10 MG tablet Take 10 mg by mouth at bedtime.  08/28/19  [provider]    Allergies: Augmentin [amoxicillin-pot clavulanate], Ceclor [cefaclor], and Codeine    Review of Systems  Updated Vital Signs BP 129/87 (BP Location: Left Arm)   Pulse 74   Temp 97.8 F (36.6 C) (Oral)   Resp 15   Ht 6' 2 (1.88 m)   Wt 110.7 kg   SpO2 99%   BMI 31.33 kg/m   Physical Exam Constitutional:      General: He is not in acute distress. HENT:     Head: Normocephalic and atraumatic.     Comments: Oropharynx  non-erythematous.  No tonsillar swelling or exudate.  No uvular deviation.  No drooling. No brawny edema. No stridor. Voice is not muffled. Eyes:     Conjunctiva/sclera: Conjunctivae normal.     Pupils: Pupils are equal, round, and reactive to light.  Cardiovascular:     Rate and Rhythm: Normal rate and regular rhythm.  Pulmonary:     Effort: Pulmonary effort is normal. No respiratory distress.  Abdominal:     General: There is no distension.     Tenderness: There is no abdominal tenderness.  Skin:    General: Skin is warm and dry.  Neurological:     General: No focal deficit present.     Mental Status: He is alert. Mental status is at baseline.  Psychiatric:        Mood and Affect: Mood normal.        Behavior: Behavior normal.     (all labs ordered are listed, but only abnormal results are displayed) Labs Reviewed  BASIC METABOLIC PANEL WITH GFR  CBC    EKG: None  Radiology: DG Neck Soft Tissue Result Date: 12/30/2023 CLINICAL DATA:  Sensation of foreign body in trachea after swallowing pickle last night. EXAM: NECK SOFT TISSUES - 2 VIEW COMPARISON:  None Available. FINDINGS: There is  no evidence of retropharyngeal soft tissue swelling or epiglottic enlargement. Unusual opacity is seen in the vestibule just above the vocal cords, which could represent a foreign body. IMPRESSION: Unusual opacity in the vestibule just above the vocal cords, which could represent a foreign body. Direct visualization is recommended. Electronically Signed   By: Norleen DELENA Kil M.D.   On: 12/30/2023 11:49     Procedures   Medications Ordered in the ED  diazepam  (VALIUM ) injection 2.5 mg (2.5 mg Intravenous Given 12/30/23 1945)  glucagon  (human recombinant) (GLUCAGEN ) injection 1 mg (1 mg Intravenous Given 12/30/23 1948)  lidocaine  (XYLOCAINE ) 2 % viscous mouth solution 15 mL (15 mLs Mouth/Throat Given 12/30/23 1938)  lidocaine  2% Nebulized 5 mL (5 mLs Nebulization Given 12/30/23 2043)     Clinical Course as of 12/30/23 2250  Thu Dec 30, 2023  2141 Dr byers to come scope [MT]  2221 No foreign body visualized by Dr Roark on NPL - suspect globus sensation [MT]    Clinical Course User Index [MT] Marrio Scribner, Donnice PARAS, MD                                 Medical Decision Making Amount and/or Complexity of Data Reviewed Labs: ordered.  Risk Prescription drug management.   This patient presents to the ED with concern for foreign body sensation in throat  Airway exam patent on arrival Tolerating secretions; doubt esophageal impaction  Xray reviewed from UC - question of foreign body above vocal cords (strange as I would not expect organic material like a pickle to visualize on an xray).  After my own attempt to numb and visualize the vocal cords failed due to gag reflex, I consulted ENT and Dr Roark performed bedside assessment.  No foreign body visualized.  Pt's labs and vitals wnl  Given IV glucagon  and valium .  Will manage at home for suspected globus sensation  Okay for discharge  Disposition:  After consideration of the diagnostic results and the patient's response to treatment, I feel that the patent would benefit from outpatient PCP follow up.      Final diagnoses:  Globus sensation    ED Discharge Orders          Ordered    omeprazole  (PRILOSEC) 20 MG capsule  2 times daily before meals        12/30/23 2235    diazepam  (VALIUM ) 2 MG tablet  At bedtime PRN        12/30/23 2235               Cottie Donnice PARAS, MD 12/30/23 2250

## 2023-12-30 NOTE — ED Notes (Signed)
 Pt has a ride coming to pick him up. Denies needs post DC. Ambulatory with a steady gait. Airway intact.

## 2023-12-30 NOTE — Consult Note (Signed)
 Reason for Consult:globus sensation Referring Physician: dr cottie  David Osborn is an 38 y.o. male.  HPI: hx of eating a hamburger at [employer] and the pickle quickly drew down his throat. He did not have a coughing.he has no pain. He was seen in urgent care and had soft tissue xray that reported something in the upper airway. He has no stridor or breathing issues. Still no cough. Able to swallow  Past Medical History:  Diagnosis Date   Hyperlipidemia    Nephrolithiasis     Past Surgical History:  Procedure Laterality Date   APPENDECTOMY  2009    Family History  Problem Relation Age of Onset   Colon cancer Paternal Grandfather    Colon polyps Paternal Grandfather    Prostate cancer Paternal Grandfather    Heart disease Paternal Grandfather    Kidney nephrosis Paternal Grandfather    Colon polyps Father    Kidney nephrosis Father    Diabetes Mother    Diabetes Maternal Grandmother    Heart disease Maternal Grandmother     Social History:  reports that he has never smoked. He has never used smokeless tobacco. He reports current alcohol use. He reports that he does not use drugs.  Allergies:  Allergies  Allergen Reactions   Augmentin [Amoxicillin-Pot Clavulanate]    Ceclor [Cefaclor]    Codeine     Medications: I have reviewed the patient's current medications.  Results for orders placed or performed during the hospital encounter of 12/30/23 (from the past 48 hours)  Basic metabolic panel     Status: None   Collection Time: 12/30/23  7:45 PM  Result Value Ref Range   Sodium 138 135 - 145 mmol/L   Potassium 3.7 3.5 - 5.1 mmol/L   Chloride 103 98 - 111 mmol/L   CO2 24 22 - 32 mmol/L   Glucose, Bld 98 70 - 99 mg/dL    Comment: Glucose reference range applies only to samples taken after fasting for at least 8 hours.   BUN 15 6 - 20 mg/dL   Creatinine, Ser 8.97 0.61 - 1.24 mg/dL   Calcium 9.9 8.9 - 89.6 mg/dL   GFR, Estimated >39 >39 mL/min    Comment:  (NOTE) Calculated using the CKD-EPI Creatinine Equation (2021)    Anion gap 11 5 - 15    Comment: Performed at [facility], [address], [place], [place] [address]  CBC     Status: None   Collection Time: 12/30/23  7:45 PM  Result Value Ref Range   WBC 8.1 4.0 - 10.5 K/uL   RBC 5.18 4.22 - 5.81 MIL/uL   Hemoglobin 15.2 13.0 - 17.0 g/dL   HCT 55.1 60.9 - 47.9 %   MCV 86.5 80.0 - 100.0 fL   MCH 29.3 26.0 - 34.0 pg   MCHC 33.9 30.0 - 36.0 g/dL   RDW 87.0 88.4 - 84.4 %   Platelets 344 150 - 400 K/uL   nRBC 0.0 0.0 - 0.2 %    Comment: Performed at [facility], [address], [place], [place] [address]    DG Neck Soft Tissue Result Date: 12/30/2023 CLINICAL DATA:  Sensation of foreign body in trachea after swallowing pickle last night. EXAM: NECK SOFT TISSUES - 2 VIEW COMPARISON:  None Available. FINDINGS: There is no evidence of retropharyngeal soft tissue swelling or epiglottic enlargement. Unusual opacity is seen in the vestibule just above the vocal cords, which could represent a foreign body. IMPRESSION:  Unusual opacity in the vestibule just above the vocal cords, which could represent a foreign body. Direct visualization is recommended. Electronically Signed   By: Norleen DELENA Kil M.D.   On: 12/30/2023 11:49    ROS Blood pressure 129/87, pulse 74, temperature 97.8 F (36.6 C), temperature source Oral, resp. rate 15, height 6' 2 (1.88 m), weight 110.7 kg, SpO2 99%. Physical Exam Constitutional:      Appearance: Normal appearance.  HENT:     Head: Normocephalic.     Right Ear: External ear normal.     Left Ear: External ear normal.     Nose: Nose normal.     Mouth/Throat:     Comments: No lesions or foreign body  FOE- np is clear. The epiglottis,vallecula, hypopharynx without swelling or foreign body. The glottis has no swelling and no lesions. The TVC move normal and the subglottis looks clear.  Eyes:     Pupils: Pupils are equal, round, and reactive to light.   Neurological:     Mental Status: He is alert.       Assessment/Plan: Globus- I see no pickle or any other foreign body on FOE. He probably has some irritation from the odd swallowing of the food. I doubt it is in the cricopharyngeal area but could be. Not worth going to OR at this point with no real symptoms other than globus. I certainly doubt the pickle is in  the airway. Recommend some BID prilosec for week and follow up if symptoms remain in few days to week.   Norleen Notice 12/30/2023, 10:18 PM

## 2023-12-30 NOTE — ED Notes (Signed)
 Provider at bedside

## 2023-12-30 NOTE — ED Provider Notes (Addendum)
 [facility]    CSN: [id number] Arrival date & time: 12/30/23  9048      History   Chief Complaint Chief Complaint  Patient presents with   food stuck    HPI David Osborn is a 38 y.o. male.   Patient is here for feeling like there is something stuck in his throat.  He was eating a hamburger last night, inhaled a pickle and felt like it went into his wind pipe.   Had a coughing fit afterward.  Today he feels a lump in his throat, and if he swallows feels it in there.  No difficulty breathing per se.  Discomfort is located at the front of the neck/trachea area.        Past Medical History:  Diagnosis Date   Hyperlipidemia    Nephrolithiasis     Patient Active Problem List   Diagnosis Date Noted   Ureterolithiasis 11/06/2015    Past Surgical History:  Procedure Laterality Date   APPENDECTOMY  2009       Home Medications    Prior to Admission medications   Medication Sig Start Date End Date Taking? Authorizing Provider  ibuprofen  (ADVIL ) 600 MG tablet Take 1 tablet (600 mg total) by mouth every 6 (six) hours as needed. 12/27/23  Yes Vicky Charleston, PA-C  cyclobenzaprine  (FLEXERIL ) 10 MG tablet Take 1 tablet (10 mg total) by mouth 3 (three) times daily as needed for muscle spasms. 12/27/23   Vicky Charleston, PA-C  albuterol  (PROVENTIL  HFA;VENTOLIN  HFA) 108 (90 Base) MCG/ACT inhaler Inhale 1-2 puffs into the lungs every 6 (six) hours as needed for wheezing or shortness of breath. 07/08/16 08/28/19  Pennie Elsie PARAS, FNP  simvastatin (ZOCOR) 10 MG tablet Take 10 mg by mouth at bedtime.  08/28/19  [provider]    Family History Family History  Problem Relation Age of Onset   Colon cancer Paternal Grandfather    Colon polyps Paternal Grandfather    Prostate cancer Paternal Grandfather    Heart disease Paternal Grandfather    Kidney nephrosis Paternal Grandfather    Colon polyps Father    Kidney nephrosis Father    Diabetes Mother     Diabetes Maternal Grandmother    Heart disease Maternal Grandmother     Social History Social History   Tobacco Use   Smoking status: Never   Smokeless tobacco: Never  Vaping Use   Vaping status: Never Used  Substance Use Topics   Alcohol use: Yes    Alcohol/week: 0.0 standard drinks of alcohol    Comment: social   Drug use: No     Allergies   Augmentin [amoxicillin-pot clavulanate], Ceclor [cefaclor], and Codeine   Review of Systems Review of Systems  Constitutional: Negative.   HENT:  Positive for sore throat. Negative for trouble swallowing.   Respiratory: Negative.    Cardiovascular: Negative.   Gastrointestinal: Negative.   Musculoskeletal: Negative.   Psychiatric/Behavioral: Negative.       Physical Exam Triage Vital Signs ED Triage Vitals  Encounter Vitals Group     BP 12/30/23 1008 118/78     Girls Systolic BP Percentile --      Girls Diastolic BP Percentile --      Boys Systolic BP Percentile --      Boys Diastolic BP Percentile --      Pulse Rate 12/30/23 1008 69     Resp 12/30/23 1008 18     Temp 12/30/23 1008 98.7 F (37.1 C)  Temp src --      SpO2 12/30/23 1008 96 %     Weight --      Height --      Head Circumference --      Peak Flow --      Pain Score 12/30/23 1005 2     Pain Loc --      Pain Education --      Exclude from Growth Chart --    No data found.  Updated Vital Signs BP 118/78   Pulse 69   Temp 98.7 F (37.1 C)   Resp 18   SpO2 96%   Visual Acuity Right Eye Distance:   Left Eye Distance:   Bilateral Distance:    Right Eye Near:   Left Eye Near:    Bilateral Near:     Physical Exam Constitutional:      General: He is not in acute distress.    Appearance: Normal appearance. He is normal weight. He is not ill-appearing or toxic-appearing.  HENT:     Nose: Nose normal.     Mouth/Throat:     Mouth: Mucous membranes are moist.     Pharynx: No oropharyngeal exudate or posterior oropharyngeal erythema.   Cardiovascular:     Rate and Rhythm: Normal rate and regular rhythm.  Pulmonary:     Effort: Pulmonary effort is normal.     Breath sounds: Normal breath sounds.  Musculoskeletal:     Cervical back: Normal range of motion and neck supple. Tenderness present.  Lymphadenopathy:     Cervical: No cervical adenopathy.  Skin:    General: Skin is warm.  Neurological:     General: No focal deficit present.     Mental Status: He is alert.  Psychiatric:        Mood and Affect: Mood normal.      UC Treatments / Results  Labs (all labs ordered are listed, but only abnormal results are displayed) Labs Reviewed - No data to display  EKG   Radiology No results found.  Procedures Procedures (including critical care time)  Medications Ordered in UC Medications - No data to display  Initial Impression / Assessment and Plan / UC Course  I have reviewed the triage vital signs and the nursing notes.  Pertinent labs & imaging results that were available during my care of the patient were reviewed by me and considered in my medical decision making (see chart for details).  Per radiologist's report there is concern for retained FB. I have called the patient and made him aware.  He is to call Dr. Roark for a follow up appointment.   Final Clinical Impressions(s) / UC Diagnoses   Final diagnoses:  Foreign body in trachea, initial encounter     Discharge Instructions      You were seen for possible foreign body in the throat.  I did not seen anything concerning on xray.  I have talked to the on call provider and advised that you call the office for an appointment.  I have also put in a referral to Dr. Roark.    Address: [address] [place], [place], [place] [address] Phone: [phone]  If your symptoms worsen before being seen then please go to the ER for further evaluation.     ED Prescriptions   None    PDMP not reviewed this encounter.   Darral Longs, MD 12/30/23 1056     Darral Longs, MD 12/30/23 1212

## 2023-12-30 NOTE — ED Triage Notes (Addendum)
 PT reports he was chewing on a pickle last night and inhaled a pickle into his wind pipe. Pt in no acute distress and speaking in complete sentences during Triage. PT feels pain only when he swallows. Pt feels a lump in throat.

## 2023-12-30 NOTE — Discharge Instructions (Signed)
 You were seen for possible foreign body in the throat.  I did not seen anything concerning on xray.  I have talked to the on call provider and advised that you call the office for an appointment.  I have also put in a referral to Dr. Roark.    Address: [address] [place], [place], [place] [address] Phone: [phone]  If your symptoms worsen before being seen then please go to the ER for further evaluation.

## 2023-12-30 NOTE — ED Notes (Signed)
 Pt resting, provider at bedside attempting to look down his throat. Pt reports he still has the feeling of something stuck. Maintaining secretions. Airway intact.

## 2023-12-30 NOTE — ED Notes (Signed)
 ENT at bedside to scope.  Pt tolerating without issues.

## 2023-12-31 ENCOUNTER — Ambulatory Visit (HOSPITAL_COMMUNITY): Payer: Self-pay
